# Patient Record
Sex: Female | Born: 1960 | Race: White | Hispanic: No | Marital: Married | Smoking: Never smoker
Health system: Southern US, Community
[De-identification: ages and names within clinical notes are randomized; demographics above are authoritative.]

## PROBLEM LIST (undated history)

## (undated) DIAGNOSIS — R51 Headache: Secondary | ICD-10-CM

## (undated) DIAGNOSIS — M545 Low back pain, unspecified: Secondary | ICD-10-CM

## (undated) DIAGNOSIS — R339 Retention of urine, unspecified: Secondary | ICD-10-CM

## (undated) DIAGNOSIS — T4145XA Adverse effect of unspecified anesthetic, initial encounter: Secondary | ICD-10-CM

## (undated) DIAGNOSIS — R112 Nausea with vomiting, unspecified: Secondary | ICD-10-CM

## (undated) DIAGNOSIS — T753XXA Motion sickness, initial encounter: Secondary | ICD-10-CM

## (undated) DIAGNOSIS — F32A Depression, unspecified: Secondary | ICD-10-CM

## (undated) DIAGNOSIS — N952 Postmenopausal atrophic vaginitis: Secondary | ICD-10-CM

## (undated) DIAGNOSIS — F329 Major depressive disorder, single episode, unspecified: Secondary | ICD-10-CM

## (undated) DIAGNOSIS — Z0282 Encounter for adoption services: Secondary | ICD-10-CM

## (undated) DIAGNOSIS — M1990 Unspecified osteoarthritis, unspecified site: Secondary | ICD-10-CM

## (undated) DIAGNOSIS — R319 Hematuria, unspecified: Secondary | ICD-10-CM

## (undated) DIAGNOSIS — R519 Headache, unspecified: Secondary | ICD-10-CM

## (undated) DIAGNOSIS — Z87442 Personal history of urinary calculi: Secondary | ICD-10-CM

## (undated) DIAGNOSIS — IMO0001 Reserved for inherently not codable concepts without codable children: Secondary | ICD-10-CM

## (undated) DIAGNOSIS — F419 Anxiety disorder, unspecified: Secondary | ICD-10-CM

## (undated) DIAGNOSIS — J45909 Unspecified asthma, uncomplicated: Secondary | ICD-10-CM

## (undated) DIAGNOSIS — T8859XA Other complications of anesthesia, initial encounter: Secondary | ICD-10-CM

## (undated) DIAGNOSIS — E039 Hypothyroidism, unspecified: Secondary | ICD-10-CM

## (undated) DIAGNOSIS — Z9889 Other specified postprocedural states: Secondary | ICD-10-CM

## (undated) HISTORY — DX: Encounter for adoption services: Z02.82

## (undated) HISTORY — DX: Anxiety disorder, unspecified: F41.9

## (undated) HISTORY — DX: Hypothyroidism, unspecified: E03.9

## (undated) HISTORY — DX: Reserved for inherently not codable concepts without codable children: IMO0001

## (undated) HISTORY — DX: Unspecified osteoarthritis, unspecified site: M19.90

## (undated) HISTORY — DX: Low back pain: M54.5

## (undated) HISTORY — PX: TMJ ARTHROPLASTY: SHX1066

## (undated) HISTORY — PX: INCONTINENCE SURGERY: SHX676

## (undated) HISTORY — DX: Major depressive disorder, single episode, unspecified: F32.9

## (undated) HISTORY — PX: CERVICAL POLYPECTOMY: SHX88

## (undated) HISTORY — DX: Unspecified asthma, uncomplicated: J45.909

## (undated) HISTORY — DX: Depression, unspecified: F32.A

## (undated) HISTORY — PX: KNEE SURGERY: SHX244

## (undated) HISTORY — PX: COLON SURGERY: SHX602

## (undated) HISTORY — DX: Low back pain, unspecified: M54.50

## (undated) HISTORY — DX: Retention of urine, unspecified: R33.9

## (undated) HISTORY — DX: Postmenopausal atrophic vaginitis: N95.2

## (undated) HISTORY — DX: Hematuria, unspecified: R31.9

---

## 2008-03-16 ENCOUNTER — Ambulatory Visit: Payer: Self-pay | Admitting: Family Medicine

## 2008-06-14 ENCOUNTER — Ambulatory Visit: Payer: Self-pay | Admitting: Internal Medicine

## 2008-06-28 ENCOUNTER — Ambulatory Visit: Payer: Self-pay | Admitting: Internal Medicine

## 2010-12-09 ENCOUNTER — Ambulatory Visit: Payer: Self-pay | Admitting: Internal Medicine

## 2010-12-31 ENCOUNTER — Ambulatory Visit: Payer: Self-pay | Admitting: Internal Medicine

## 2011-01-20 ENCOUNTER — Ambulatory Visit: Payer: Self-pay | Admitting: Obstetrics & Gynecology

## 2011-01-30 ENCOUNTER — Ambulatory Visit: Payer: Self-pay | Admitting: Obstetrics & Gynecology

## 2011-02-03 LAB — PATHOLOGY REPORT

## 2011-03-05 ENCOUNTER — Ambulatory Visit: Payer: Self-pay | Admitting: Obstetrics & Gynecology

## 2013-02-26 ENCOUNTER — Ambulatory Visit: Payer: Self-pay | Admitting: Family Medicine

## 2013-02-26 ENCOUNTER — Emergency Department: Payer: Self-pay | Admitting: Emergency Medicine

## 2013-02-26 LAB — BASIC METABOLIC PANEL
Anion Gap: 5 — ABNORMAL LOW (ref 7–16)
Calcium, Total: 8.9 mg/dL (ref 8.5–10.1)
Co2: 26 mmol/L (ref 21–32)
Creatinine: 0.92 mg/dL (ref 0.60–1.30)
EGFR (African American): >60
EGFR (Non-African Amer.): >60

## 2013-02-26 LAB — CK TOTAL AND CKMB (NOT AT ARMC): CK, Total: 109 U/L (ref 21–215)

## 2013-02-26 LAB — TROPONIN I: Troponin-I: <0.02 ng/mL

## 2013-02-26 LAB — CBC
MCH: 32.3 pg (ref 26.0–34.0)
MCHC: 33.1 g/dL (ref 32.0–36.0)
MCV: 97 fL (ref 80–100)

## 2013-03-13 ENCOUNTER — Ambulatory Visit: Payer: Self-pay | Admitting: Internal Medicine

## 2013-07-04 ENCOUNTER — Ambulatory Visit: Payer: Self-pay

## 2013-10-30 ENCOUNTER — Ambulatory Visit: Payer: Self-pay | Admitting: Urology

## 2014-02-26 LAB — COMPREHENSIVE METABOLIC PANEL
ALK PHOS: 57 U/L
ALT: 27 U/L (ref 12–78)
Albumin: 4.3 g/dL (ref 3.4–5.0)
Anion Gap: 3 — ABNORMAL LOW (ref 7–16)
BILIRUBIN TOTAL: 0.6 mg/dL (ref 0.2–1.0)
BUN: 21 mg/dL — ABNORMAL HIGH (ref 7–18)
CALCIUM: 9.2 mg/dL (ref 8.5–10.1)
Chloride: 107 mmol/L (ref 98–107)
Co2: 31 mmol/L (ref 21–32)
Creatinine: 0.76 mg/dL (ref 0.60–1.30)
Glucose: 91 mg/dL (ref 65–99)
OSMOLALITY: 284 (ref 275–301)
Potassium: 4.2 mmol/L (ref 3.5–5.1)
SGOT(AST): 38 U/L — ABNORMAL HIGH (ref 15–37)
SODIUM: 141 mmol/L (ref 136–145)
Total Protein: 7.5 g/dL (ref 6.4–8.2)

## 2014-02-26 LAB — URINALYSIS, COMPLETE
BILIRUBIN, UR: NEGATIVE
Bacteria: NONE SEEN
Blood: NEGATIVE
Glucose,UR: NEGATIVE mg/dL (ref 0–75)
Ketone: NEGATIVE
Nitrite: NEGATIVE
PH: 6 (ref 4.5–8.0)
PROTEIN: NEGATIVE
SPECIFIC GRAVITY: 1.02 (ref 1.003–1.030)
Squamous Epithelial: 1
WBC UR: 4 /HPF (ref 0–5)

## 2014-02-26 LAB — DRUG SCREEN, URINE

## 2014-02-26 LAB — CBC
HCT: 40.5 % (ref 35.0–47.0)
HGB: 13.3 g/dL (ref 12.0–16.0)
MCH: 31.6 pg (ref 26.0–34.0)
MCHC: 32.9 g/dL (ref 32.0–36.0)
MCV: 96 fL (ref 80–100)
Platelet: 181 10*3/uL (ref 150–440)
RBC: 4.22 10*6/uL (ref 3.80–5.20)
RDW: 13.1 % (ref 11.5–14.5)
WBC: 5.4 10*3/uL (ref 3.6–11.0)

## 2014-02-26 LAB — SALICYLATE LEVEL: Salicylates, Serum: <1.7 mg/dL

## 2014-02-26 LAB — T4, FREE: Free Thyroxine: 1.27 ng/dL (ref 0.76–1.46)

## 2014-02-26 LAB — ACETAMINOPHEN LEVEL: Acetaminophen: <2 ug/mL

## 2014-02-26 LAB — ETHANOL
Ethanol %: <0.003 % (ref 0.000–0.080)
Ethanol: <3 mg/dL

## 2014-02-27 ENCOUNTER — Inpatient Hospital Stay: Payer: Self-pay | Admitting: Psychiatry

## 2014-09-05 ENCOUNTER — Ambulatory Visit: Payer: Self-pay | Admitting: Urology

## 2014-09-19 ENCOUNTER — Ambulatory Visit: Payer: Self-pay | Admitting: Urology

## 2015-02-23 NOTE — Discharge Summary (Signed)
PATIENT NAME:  Sheila Cole, Sheila Cole MR#:  [id number] DATE OF BIRTH:  11-14-1960  DATE OF ADMISSION:  02/27/2014  DATE OF DISCHARGE:  03/05/2014  HOSPITAL COURSE: See dictated history and physical for details of admission. A 54 year old woman with a history of depression came into the hospital very depressed, with suicidal ideation, feeling hopeless and overwhelmed, multiple major life stresses. In the hospital, she was treated with medication management and group and individual psychotherapy. Her antidepressant medications were addressed and adjusted somewhat, with addition of a new antidepressant and increase in the dose of her previous medicine. She continued to have suicidal ideation for several days, but never acted in a dangerous way or did anything aggressive. Eventually her mood improved quite a bit, and at the time of discharge, she was consistently saying that her mood felt better. Denied any suicidal ideation. Felt more hopeful and optimistic about her outpatient treatment. Agreed to a plan for outpatient [facility]. The patient was counseled about the importance of staying on her medication.   DISCHARGE MEDICATIONS: Trazodone 100 mg at night, clonazepam 0.5 mg in the morning, Zantac 150 mg twice a day, levothyroxine 125 mcg once a day, ibuprofen 800 mg twice a day as needed for pain, sumatriptan 50 mg every 2 hours as needed orally for migraine, bupropion extended-release 300 mg once a day, mirtazapine 30 mg at night.   MENTAL STATUS EXAMINATION: A casually dressed, reasonably well-groomed woman, looks her stated age, cooperative with the interview. Good eye contact, normal psychomotor activity. Speech normal rate, tone and volume. Affect euthymic, reactive, appropriate. Mood stated as okay. Thoughts are lucid. No loosening of associations or delusions. Denies auditory or visual hallucinations. Denies suicidal or homicidal ideation. Shows good judgment and  insight. Normal intelligence. Alert and oriented x 4.   LABORATORY RESULTS: Admission labs included a negative drug screen. Urinalysis unremarkable. CBC unremarkable. Chemistry panel: Only a slightly elevated BUN of no significance at 21. Alcohol negative. Acetaminophen and salicylates negative. Free thyroxine normal. CT scan of the neck done while she was here did not show any blockage. Her complaints of difficulty swallowing were addressed during her hospitalization by Speech Therapy, and showed significant improvement by the time of discharge. I suspect it was largely related to anxiety.   DISPOSITION: Discharge home. [facility].   DIAGNOSIS, PRINCIPAL AND PRIMARY:  AXIS I: Major depression, severe, recurrent.   SECONDARY DIAGNOSES: AXIS I: Anxiety disorder, not otherwise specified. AXIS II: Deferred. AXIS III: History of thyroidectomy, on thyroid replacement. Chronic pain and headaches. Dysphagia of unclear etiology, resolving.  AXIS IV: Severe from recent breakup of her marriage.  AXIS V: Functioning at time of discharge: 60.      ____________________________ Audery AmelJohn T. Clapacs, MD jtc:mr D: 03/24/2014 12:16:28 ET T: 03/24/2014 22:39:58 ET JOB#: [id number]  cc: Audery AmelJohn T. Clapacs, MD, <Dictator> Audery AmelJOHN T CLAPACS MD ELECTRONICALLY SIGNED 04/05/2014 11:31

## 2015-02-23 NOTE — H&P (Signed)
PATIENT NAME:  Sheila Cole, Sheila Cole MR#:  [id number] DATE OF BIRTH:  [birth year]  DATE OF ADMISSION:  02/27/2014   IDENTIFYING INFORMATION AND CHIEF COMPLAINT:  This is a 54 year old woman who came to the Emergency Room at the referral of her primary care doctor.   CHIEF COMPLAINT:  "I am feeling suicidal."   HISTORY OF PRESENT ILLNESS:  Information obtained from the patient and the chart.  The patient came to the Emergency Room stating that she had been having worsening depressive symptoms for months that had culminated with intrusive thoughts of suicide.  She says that she has probably been feeling depressed for a year or more, but that things just started to get much worse in the last couple months and especially bad in the last few weeks.  The last couple of weeks she has been feeling down and sad all the time.  Energy is very poor.  No interest in normal activities.  Decreased appetite.  She started having intrusive suicidal ideation with thoughts of overdosing on sleeping pills.  She informed her primary care doctor that she was feeling suicidal.  He had her come to the hospital for evaluation.  The patient does not report any psychotic symptoms.  She denies that she has been abusing alcohol or drugs.  She is not currently taking an antidepressant medication.  A major stress on her is that her husband had gone into rehab for substance abuse and when he came out he immediately relapsed into drinking.  She had been hoping for some improvement in their relationship and this pushed her over the edge.   PAST PSYCHIATRIC HISTORY:  The patient has had psychiatric treatment in the past.  As a teenager she had suicidal ideation and some wrist cutting, but no hospitalization.  She had been treated in the past by her primary care doctor with antidepressant medication and had been on bupropion XL 150 mg a day.  She reports that when the dose was increased her insurance would not pay for it so she stopped taking it.  No  history of psychosis.   SUBSTANCE ABUSE HISTORY:  Denies regular alcohol use or drug use herself.  Says she drinks alcohol only occasionally and has not increased her use of it.   FAMILY HISTORY:  Not aware of any.   SOCIAL HISTORY:  The patient lives with her two adolescent sons.  She and her husband are separated.  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  They have someone else managing it so she does not necessarily need to be there constantly.   PAST MEDICAL HISTORY:  The patient has a history of having a goiter removed resulting in clinical hypothyroidism for which she takes thyroid supplement.  She has migraine headaches.  Gastric reflux symptoms.   CURRENT MEDICATIONS:  Synthroid 125 mcg per day, clonazepam 0.5 mg per day as needed for anxiety, Zantac 150 mg twice a day, Aleve 3 to 4 times a day for arthritis pain.   ALLERGIES:  No known drug allergies.   REVIEW OF SYSTEMS:  Depressed mood.  Low energy.  Poor appetite.  Suicidal ideation.  Denies hallucinations.  Denies paranoia.  She has aches and pains in multiple joints.  The rest of the regular review of systems is negative.    MENTAL STATUS EXAMINATION:  Casually dressed, adequately groomed woman, looks her stated age, cooperative with the interview.  Eye contact good.  Psychomotor activity a little slow and restrained.  Speech is easily understood and normal in tone, but decreased in total amount.  Affect blunted.  Mood stated as depressed.  Thoughts are generally lucid.  No obvious delusions.  Denies auditory or visual hallucinations.  Endorses ongoing suicidal ideation.  Denies homicidal ideation.  Judgment and insight a little bit impaired by the degree of her depression.  Intelligence normal.  Short and long-term memory intact to testing.   PHYSICAL EXAMINATION: GENERAL:  Tall rather thin woman who gives the impression of being  exceptionally thin for her height.  SKIN:  No skin lesions identified.  HEENT:  Pupils equal and reactive.  Face symmetric.  Oral mucosa normal.  NECK AND BACK:  Nontender to palpation.  EXTREMITIES:  Full range of motion at all extremities.  Normal gait.  Strength and reflexes normal and symmetric throughout.  Cranial nerves symmetric and normal.  LUNGS:  Clear without any wheezing.  HEART:  Sounds regular rate and rhythm.  ABDOMEN:  Soft, nontender, normal bowel sounds.  VITAL SIGNS:  At the time of evaluation included temperature of 97.5, pulse 66, respirations 20, blood pressure 123/83.   LABORATORY RESULTS:  Admission labs include free thyroxine 1.27 which is normal.  Alcohol level non-detected.  AST slightly elevated at 38.  BUN slightly elevated at 21.  Drug screen negative.  CBC normal.   ASSESSMENT:  A 54 year old woman who gives symptoms of multiple symptoms of major depression.  Ongoing suicidal ideation.  Not psychotic.  Had not been on any antidepressant, not seeing a psychiatrist recently.  Needs hospitalization for suicidality and treatment.   TREATMENT PLAN:  Admit to psychiatry.  I discussed medication options with her and suggested that based on her past history we start her on bupropion again.  Start her right back on 300 mg of bupropion XL.  Continue trazodone 100 mg at night as needed for sleep.  Continue clonazepam as needed.  Treat medical problems appropriately.  Engage the patient in groups and activities on the unit, work on referral for outpatient treatment when she is ready for discharge.   DIAGNOSIS, PRINCIPAL AND PRIMARY:  AXIS I:  Major depression, single, severe.   SECONDARY DIAGNOSES: AXIS I:  No further. AXIS II:  Deferred.  AXIS III:  Migraine headaches, hypothyroidism possibly, status post surgery, arthritis.  AXIS IV:  Severe stress from financial problems and the difficulty in her marriage.  AXIS V:  Functioning at time of evaluation 30.      ____________________________ Audery Amel, MD jtc:ea D: 02/28/2014 22:52:46 ET T: 02/28/2014 23:51:24 ET JOB#: [id number]  cc: Audery Amel, MD, <Dictator> Audery Amel MD ELECTRONICALLY SIGNED 03/01/2014 13:22

## 2015-03-22 ENCOUNTER — Other Ambulatory Visit: Payer: Self-pay | Admitting: Urology

## 2015-03-22 DIAGNOSIS — R310 Gross hematuria: Secondary | ICD-10-CM

## 2015-03-26 ENCOUNTER — Ambulatory Visit: Admission: RE | Admit: 2015-03-26 | Payer: No Typology Code available for payment source | Source: Ambulatory Visit

## 2015-04-03 ENCOUNTER — Ambulatory Visit: Payer: Self-pay | Admitting: Urology

## 2015-04-04 ENCOUNTER — Telehealth: Payer: Self-pay | Admitting: Urology

## 2015-04-04 NOTE — Telephone Encounter (Signed)
Please call the patient to r/s her CT Urogram

## 2015-04-17 ENCOUNTER — Encounter: Payer: Self-pay | Admitting: Urology

## 2015-04-17 NOTE — Telephone Encounter (Signed)
Letter sent to patient to contact our office to reschedule CT scan and follow up.

## 2015-12-04 ENCOUNTER — Other Ambulatory Visit: Payer: Self-pay | Admitting: Orthopedic Surgery

## 2015-12-04 DIAGNOSIS — M25561 Pain in right knee: Secondary | ICD-10-CM

## 2015-12-04 DIAGNOSIS — M2391 Unspecified internal derangement of right knee: Secondary | ICD-10-CM

## 2015-12-04 DIAGNOSIS — G8929 Other chronic pain: Secondary | ICD-10-CM

## 2015-12-26 ENCOUNTER — Ambulatory Visit: Payer: No Typology Code available for payment source

## 2015-12-30 ENCOUNTER — Ambulatory Visit
Admission: RE | Admit: 2015-12-30 | Discharge: 2015-12-30 | Disposition: A | Discharge status: Home / Self Care | Payer: Managed Care, Other (non HMO) | Source: Ambulatory Visit | Attending: Orthopedic Surgery | Admitting: Orthopedic Surgery

## 2015-12-30 DIAGNOSIS — X58XXXA Exposure to other specified factors, initial encounter: Secondary | ICD-10-CM | POA: Diagnosis not present

## 2015-12-30 DIAGNOSIS — G8929 Other chronic pain: Secondary | ICD-10-CM

## 2015-12-30 DIAGNOSIS — S83281A Other tear of lateral meniscus, current injury, right knee, initial encounter: Secondary | ICD-10-CM | POA: Diagnosis not present

## 2015-12-30 DIAGNOSIS — M2391 Unspecified internal derangement of right knee: Secondary | ICD-10-CM

## 2015-12-30 DIAGNOSIS — M25561 Pain in right knee: Secondary | ICD-10-CM | POA: Diagnosis present

## 2016-01-20 ENCOUNTER — Encounter: Payer: Self-pay | Admitting: *Deleted

## 2016-01-24 ENCOUNTER — Ambulatory Visit: Payer: Managed Care, Other (non HMO) | Admitting: Anesthesiology

## 2016-01-24 ENCOUNTER — Encounter
Admission: RE | Disposition: A | Discharge status: Home / Self Care | Payer: Self-pay | Source: Ambulatory Visit | Attending: Unknown Physician Specialty

## 2016-01-24 ENCOUNTER — Ambulatory Visit
Admission: RE | Admit: 2016-01-24 | Discharge: 2016-01-24 | Disposition: A | Discharge status: Home / Self Care | Payer: Managed Care, Other (non HMO) | Source: Ambulatory Visit | Attending: Unknown Physician Specialty | Admitting: Unknown Physician Specialty

## 2016-01-24 DIAGNOSIS — F419 Anxiety disorder, unspecified: Secondary | ICD-10-CM | POA: Insufficient documentation

## 2016-01-24 DIAGNOSIS — E039 Hypothyroidism, unspecified: Secondary | ICD-10-CM | POA: Insufficient documentation

## 2016-01-24 DIAGNOSIS — Z79899 Other long term (current) drug therapy: Secondary | ICD-10-CM | POA: Diagnosis not present

## 2016-01-24 DIAGNOSIS — J45909 Unspecified asthma, uncomplicated: Secondary | ICD-10-CM | POA: Diagnosis not present

## 2016-01-24 DIAGNOSIS — R51 Headache: Secondary | ICD-10-CM | POA: Diagnosis not present

## 2016-01-24 DIAGNOSIS — M23241 Derangement of anterior horn of lateral meniscus due to old tear or injury, right knee: Secondary | ICD-10-CM | POA: Insufficient documentation

## 2016-01-24 DIAGNOSIS — M23261 Derangement of other lateral meniscus due to old tear or injury, right knee: Secondary | ICD-10-CM | POA: Diagnosis not present

## 2016-01-24 DIAGNOSIS — M1990 Unspecified osteoarthritis, unspecified site: Secondary | ICD-10-CM | POA: Insufficient documentation

## 2016-01-24 DIAGNOSIS — M419 Scoliosis, unspecified: Secondary | ICD-10-CM | POA: Insufficient documentation

## 2016-01-24 DIAGNOSIS — M25561 Pain in right knee: Secondary | ICD-10-CM | POA: Diagnosis present

## 2016-01-24 HISTORY — DX: Headache, unspecified: R51.9

## 2016-01-24 HISTORY — DX: Other specified postprocedural states: Z98.890

## 2016-01-24 HISTORY — DX: Adverse effect of unspecified anesthetic, initial encounter: T41.45XA

## 2016-01-24 HISTORY — DX: Nausea with vomiting, unspecified: R11.2

## 2016-01-24 HISTORY — DX: Headache: R51

## 2016-01-24 HISTORY — DX: Other complications of anesthesia, initial encounter: T88.59XA

## 2016-01-24 HISTORY — PX: KNEE ARTHROSCOPY: SHX127

## 2016-01-24 SURGERY — ARTHROSCOPY, KNEE
Anesthesia: General | Site: Knee | Laterality: Right | Wound class: Clean

## 2016-01-24 MED ORDER — SCOPOLAMINE 1 MG/3DAYS TD PT72
1.0000 | MEDICATED_PATCH | TRANSDERMAL | Status: DC
  Administered 2016-01-24: 1.5 mg via TRANSDERMAL

## 2016-01-24 MED ORDER — MIDAZOLAM HCL 2 MG/2ML IJ SOLN
1.0000 mg | Freq: Once | INTRAMUSCULAR | Status: AC
  Administered 2016-01-24: 1 mg via INTRAVENOUS

## 2016-01-24 MED ORDER — GLYCOPYRROLATE 0.2 MG/ML IJ SOLN
INTRAMUSCULAR | Status: DC | PRN
  Administered 2016-01-24: 0.1 mg via INTRAVENOUS

## 2016-01-24 MED ORDER — ONDANSETRON HCL 4 MG/2ML IJ SOLN
4.0000 mg | Freq: Once | INTRAMUSCULAR | Status: AC | PRN
  Administered 2016-01-24: 4 mg via INTRAVENOUS

## 2016-01-24 MED ORDER — OXYCODONE HCL 5 MG/5ML PO SOLN: 5.0000 mg | Freq: Once | ORAL | Status: DC | PRN

## 2016-01-24 MED ORDER — LACTATED RINGERS IV SOLN
INTRAVENOUS | Status: DC
  Administered 2016-01-24: 08:00:00 via INTRAVENOUS

## 2016-01-24 MED ORDER — METOCLOPRAMIDE HCL 5 MG/ML IJ SOLN
INTRAMUSCULAR | Status: DC | PRN
  Administered 2016-01-24: 10 mg via INTRAVENOUS

## 2016-01-24 MED ORDER — DEXAMETHASONE SODIUM PHOSPHATE 4 MG/ML IJ SOLN
INTRAMUSCULAR | Status: DC | PRN
  Administered 2016-01-24: 4 mg via INTRAVENOUS

## 2016-01-24 MED ORDER — LACTATED RINGERS IV SOLN: 500.0000 mL | INTRAVENOUS | Status: DC

## 2016-01-24 MED ORDER — OXYCODONE HCL 5 MG PO TABS: 5.0000 mg | ORAL_TABLET | Freq: Once | ORAL | Status: DC | PRN

## 2016-01-24 MED ORDER — FENTANYL CITRATE (PF) 100 MCG/2ML IJ SOLN
INTRAMUSCULAR | Status: DC | PRN
  Administered 2016-01-24 (×2): 25 ug via INTRAVENOUS
  Administered 2016-01-24: 50 ug via INTRAVENOUS
  Administered 2016-01-24: 25 ug via INTRAVENOUS

## 2016-01-24 MED ORDER — LIDOCAINE HCL (CARDIAC) 20 MG/ML IV SOLN
INTRAVENOUS | Status: DC | PRN
  Administered 2016-01-24: 40 mg via INTRATRACHEAL

## 2016-01-24 MED ORDER — ONDANSETRON HCL 4 MG/2ML IJ SOLN
INTRAMUSCULAR | Status: DC | PRN
  Administered 2016-01-24: 4 mg via INTRAVENOUS

## 2016-01-24 MED ORDER — ACETAMINOPHEN 160 MG/5ML PO SOLN: 325.0000 mg | ORAL | Status: DC | PRN

## 2016-01-24 MED ORDER — ACETAMINOPHEN 325 MG PO TABS: 325.0000 mg | ORAL_TABLET | ORAL | Status: DC | PRN

## 2016-01-24 MED ORDER — TRAMADOL HCL 50 MG PO TABS
50.0000 mg | ORAL_TABLET | Freq: Once | ORAL | Status: AC
  Administered 2016-01-24: 50 mg via ORAL

## 2016-01-24 MED ORDER — HYDROMORPHONE HCL 1 MG/ML IJ SOLN: 0.2500 mg | INTRAMUSCULAR | Status: DC | PRN

## 2016-01-24 MED ORDER — HYDROCODONE-ACETAMINOPHEN 5-325 MG PO TABS: 1.0000 | ORAL_TABLET | Freq: Four times a day (QID) | ORAL | Status: DC | PRN

## 2016-01-24 MED ORDER — MIDAZOLAM HCL 5 MG/5ML IJ SOLN
INTRAMUSCULAR | Status: DC | PRN
  Administered 2016-01-24: 2 mg via INTRAVENOUS

## 2016-01-24 MED ORDER — TRAMADOL HCL 50 MG PO TABS
50.0000 mg | ORAL_TABLET | Freq: Once | ORAL | Status: AC
Start: 2016-01-24 — End: 2016-01-24
  Administered 2016-01-24: 50 mg via ORAL

## 2016-01-24 MED ORDER — ROPIVACAINE HCL 5 MG/ML IJ SOLN
INTRAMUSCULAR | Status: DC | PRN
  Administered 2016-01-24: 20 mL

## 2016-01-24 MED ORDER — PROPOFOL 10 MG/ML IV BOLUS
INTRAVENOUS | Status: DC | PRN
  Administered 2016-01-24: 140 mg via INTRAVENOUS

## 2016-01-24 SURGICAL SUPPLY — 47 items
ARTHROWAND PARAGON T2 (SURGICAL WAND) ×2
BLADE ABRADER 4.5 (BLADE) ×2 IMPLANT
BLADE FULL RADIUS 3.5 (BLADE) IMPLANT
BLADE SHAVER 4.5X7 STR FR (MISCELLANEOUS) ×2 IMPLANT
BLADE SHAVER AGGRES 5.5  STR (CUTTER)
BLADE SHAVER AGGRES 5.5 STR (CUTTER) IMPLANT
BNDG ESMARK 6X12 TAN STRL LF (GAUZE/BANDAGES/DRESSINGS) IMPLANT
BUR 5.5 NOTCHBLASTER STR (BURR) IMPLANT
BUR ABRADER 4.0 W/FLUTE AQUA (MISCELLANEOUS) IMPLANT
BUR ABRADER 5.5 BLK (MISCELLANEOUS) IMPLANT
BUR ACROMIONIZER 4.0 (BURR) ×2 IMPLANT
BUR BR 5.5 WIDE MOUTH (BURR) IMPLANT
BURR 5.5 NOTCHBLASTER STR (BURR)
BURR ABRADER 4.0 W/FLUTE AQUA (MISCELLANEOUS)
BURR ABRADER 5.5 BLK (MISCELLANEOUS)
CANNULA THRD 8.5X72 DISP (CANNULA) IMPLANT
COVER LIGHT HANDLE UNIVERSAL (MISCELLANEOUS) ×4 IMPLANT
CUFF TOURN SGL QUICK 24 (TOURNIQUET CUFF) ×1
CUFF TOURN SGL QUICK 30 (MISCELLANEOUS)
CUFF TOURN SGL QUICK 34 (TOURNIQUET CUFF)
CUFF TRNQT CYL 24X4X40X1 (TOURNIQUET CUFF) ×1 IMPLANT
CUFF TRNQT CYL 34X4X40X1 (TOURNIQUET CUFF) IMPLANT
CUFF TRNQT CYL LO 30X4X (MISCELLANEOUS) IMPLANT
DRAPE LEGGINS SURG 28X43 STRL (DRAPES) ×2 IMPLANT
DURAPREP 26ML APPLICATOR (WOUND CARE) ×2 IMPLANT
GAUZE SPONGE 4X4 12PLY STRL (GAUZE/BANDAGES/DRESSINGS) ×2 IMPLANT
GLOVE BIO SURGEON STRL SZ7.5 (GLOVE) ×4 IMPLANT
GLOVE BIO SURGEON STRL SZ8 (GLOVE) ×2 IMPLANT
GLOVE INDICATOR 8.0 STRL GRN (GLOVE) ×4 IMPLANT
GOWN STRL REIN 2XL XLG LVL4 (GOWN DISPOSABLE) ×4 IMPLANT
GOWN STRL REUS W/TWL 2XL LVL3 (GOWN DISPOSABLE) IMPLANT
IV LACTATED RINGER IRRG 3000ML (IV SOLUTION) ×2
IV LR IRRIG 3000ML ARTHROMATIC (IV SOLUTION) ×2 IMPLANT
KIT ROOM TURNOVER OR (KITS) ×2 IMPLANT
MANIFOLD 4PT FOR NEPTUNE1 (MISCELLANEOUS) ×2 IMPLANT
PACK ARTHROSCOPY KNEE (MISCELLANEOUS) ×2 IMPLANT
SET TUBE SUCT SHAVER OUTFL 24K (TUBING) ×2 IMPLANT
SOL PREP PVP 2OZ (MISCELLANEOUS) ×2
SOLUTION PREP PVP 2OZ (MISCELLANEOUS) ×1 IMPLANT
SUT ETHILON 3-0 FS-10 30 BLK (SUTURE) ×2
SUTURE EHLN 3-0 FS-10 30 BLK (SUTURE) ×1 IMPLANT
TAPE MICROFOAM 4IN (TAPE) ×2 IMPLANT
TUBING ARTHRO INFLOW-ONLY STRL (TUBING) ×2 IMPLANT
WAND ARTHRO PARAGON T2 (SURGICAL WAND) ×1 IMPLANT
WAND HAND CNTRL MULTIVAC 50 (MISCELLANEOUS) ×2 IMPLANT
WAND HAND CNTRL MULTIVAC 90 (MISCELLANEOUS) ×2 IMPLANT
WRAP KNEE W/COLD PACKS 25.5X14 (SOFTGOODS) ×2 IMPLANT

## 2016-01-24 NOTE — Anesthesia Postprocedure Evaluation (Signed)
Anesthesia Post Note  Patient: Sheila Cole  Procedure(s) Performed: Procedure(s) (LRB): ARTHROSCOPY KNEE RIGHT KNEE WITH PARTIAL LATERAL MENISECTOMY AND ABRAISION CHONDROPLASTY (Right)  Patient location during evaluation: PACU Anesthesia Type: General Level of consciousness: awake and alert Pain management: pain level controlled Vital Signs Assessment: post-procedure vital signs reviewed and stable Respiratory status: spontaneous breathing, nonlabored ventilation, respiratory function stable and patient connected to nasal cannula oxygen Cardiovascular status: blood pressure returned to baseline and stable Postop Assessment: no signs of nausea or vomiting Anesthetic complications: no    DANIEL D KOVACS

## 2016-01-24 NOTE — Progress Notes (Signed)
Pt stating she was having difficult time breathing and felt anxious, VSS, anesthesia to bedside. Note new orders

## 2016-01-24 NOTE — H&P (Signed)
  H and P reviewed. No changes. Uploaded at later date. 

## 2016-01-24 NOTE — Anesthesia Procedure Notes (Addendum)
Procedure Name: LMA Insertion Date/Time: 01/24/2016 8:59 AM Performed by: Jimmy PicketAMYOT, Xochitl Egle Pre-anesthesia Checklist: Patient identified, Emergency Drugs available, Suction available, Timeout performed and Patient being monitored Patient Re-evaluated:Patient Re-evaluated prior to inductionOxygen Delivery Method: Circle system utilized Preoxygenation: Pre-oxygenation with 100% oxygen Intubation Type: IV induction LMA: LMA inserted LMA Size: 4.0 Number of attempts: 1 Placement Confirmation: positive ETCO2 and breath sounds checked- equal and bilateral Tube secured with: Tape

## 2016-01-24 NOTE — Transfer of Care (Signed)
Immediate Anesthesia Transfer of Care Note  Patient: Sheila Cole  Procedure(s) Performed: Procedure(s): ARTHROSCOPY KNEE RIGHT KNEE WITH PARTIAL LATERAL MENISECTOMY AND ABRAISION CHONDROPLASTY (Right)  Patient Location: PACU  Anesthesia Type: General LMA  Level of Consciousness: awake, alert  and patient cooperative  Airway and Oxygen Therapy: Patient Spontanous Breathing and Patient connected to supplemental oxygen  Post-op Assessment: Post-op Vital signs reviewed, Patient's Cardiovascular Status Stable, Respiratory Function Stable, Patent Airway and No signs of Nausea or vomiting  Post-op Vital Signs: Reviewed and stable  Complications: No apparent anesthesia complications

## 2016-01-24 NOTE — Op Note (Signed)
Patient: Sheila Cole, Sheila Y.  Preoperative diagnosis: Torn lateral meniscus right knee along with multiple chondral lesions, most severe in the lateral compartment  Postop diagnosis: Same  Operation: Arthroscopic partial lateral meniscectomy along with debridement and Coblation of the chondral lesions plus abrasion chondroplasty of the lateral tibial plateau lesion  Surgeon: Randon GoldsmithKERNODLE,Cinderella Christoffersen B, MD  Anesthesia: Gen.   History: Patient's had a long history of right knee pain.  The plain films revealed mild narrowing of the lateral compartment .  The patient had an MRI which revealed a torn lateral meniscus plus tricompartmental chondral pathology with the lateral compartment being the most involved.The patient was scheduled for surgery due to persistent discomfort despite conservative treatment.  The patient was taken the operating room where satisfactory general anesthesia was achieved. A tourniquet and leg holder were was applied to the right thigh. A well leg support was applied to the nonoperative extremity. The right knee was prepped and draped in usual fashion for an arthroscopic procedure. An inflow cannula was introduced superomedially. The joint was distended with lactated Ringer's. Scope was introduced through an inferolateral puncture wound and a probe through an inferomedial puncture wound. Inspection of the medial compartment revealed  grade 2 chondral changes in the anterior aspect of the weightbearing portion of the medial femoral condyle. No meniscal pathology was noted in the medial compartment. Inspection of the intercondylar notch revealed intact cruciates. Inspection of the the lateral compartment revealed degenerative tears of the lateral meniscus involving primarily the anterior one third and middle one third. There were grade 3 chondral changes in the mid weightbearing portion of the lateral femoral condyle and a grade 4 chondral change that was just medial to the middle third of the  torn lateral meniscus. I went ahead and resected the torn portions of the lateral meniscus with a motorized resector. The remaining lateral meniscus rim was contoured with angled ArthroCare radiofrequency wands. The grade 3 lateral femoral chondral lesion was debrided with a turbo whisker and then coblated with an ArthroCare Paragon wand. The small grade 4 tibial plateau lesion was abraded with a small round power bur. Trochlear groove was inspected and appeared to be fairly smooth.  Retropatellar surface was fibrillated, primarily in the central and lateral facet area. I debrided the fibrillar material with a turbo whisker. I observed patella tracking from the superomedial portal. The patella seemed to track fairly well.  The instruments were removed from the joint at this time. The puncture wounds were closed with 3-0 nylon in vertical mattress fashion. I injected each puncture wound with several cc of half percent Marcaine without epinephrine. Betadine was applied the wounds followed by sterile dressing. An ice pack was applied to the right knee. The patient was awakened and transferred to the stretcher bed. The patient was taken to the recovery room in satisfactory condition.  The tourniquet was not inflated during the course of the procedure. Blood loss was negligible.

## 2016-01-24 NOTE — Addendum Note (Signed)
Addendum  created 01/24/16 1054 by Karren Burlyaniel D Madalee Altmann, MD   Modules edited: Orders

## 2016-01-24 NOTE — Addendum Note (Signed)
Addendum  created 01/24/16 1041 by Karren Burlyaniel D Kovacs, MD   Modules edited: Orders

## 2016-01-24 NOTE — Anesthesia Preprocedure Evaluation (Signed)
Anesthesia Evaluation  Patient identified by MRN, date of birth, ID band Patient awake    Reviewed: Allergy & Precautions, H&P , NPO status , Patient's Chart, lab work & pertinent test results, reviewed documented beta blocker date and time   History of Anesthesia Complications (+) PONV and history of anesthetic complications  Airway Mallampati: II  TM Distance: >3 FB Neck ROM: full    Dental no notable dental hx.    Pulmonary asthma ,    Pulmonary exam normal breath sounds clear to auscultation       Cardiovascular Exercise Tolerance: Good negative cardio ROS   Rhythm:regular Rate:Normal     Neuro/Psych  Headaches, PSYCHIATRIC DISORDERS    GI/Hepatic negative GI ROS, Neg liver ROS,   Endo/Other  Hypothyroidism   Renal/GU negative Renal ROS  negative genitourinary   Musculoskeletal   Abdominal   Peds  Hematology negative hematology ROS (+)   Anesthesia Other Findings   Reproductive/Obstetrics negative OB ROS                             Anesthesia Physical Anesthesia Plan  ASA: II  Anesthesia Plan: General LMA   Post-op Pain Management:    Induction:   Airway Management Planned:   Additional Equipment:   Intra-op Plan:   Post-operative Plan:   Informed Consent: I have reviewed the patients History and Physical, chart, labs and discussed the procedure including the risks, benefits and alternatives for the proposed anesthesia with the patient or authorized representative who has indicated his/her understanding and acceptance.     Plan Discussed with: CRNA  Anesthesia Plan Comments:         Anesthesia Quick Evaluation

## 2016-01-24 NOTE — Addendum Note (Signed)
Addendum  created 01/24/16 1049 by Karren Burlyaniel D Kovacs, MD   Modules edited: Orders

## 2016-01-24 NOTE — Discharge Instructions (Signed)
General Anesthesia, Adult, Care After °Refer to this sheet in the next few weeks. These instructions provide you with information on caring for yourself after your procedure. Your health care provider may also give you more specific instructions. Your treatment has been planned according to current medical practices, but problems sometimes occur. Call your health care provider if you have any problems or questions after your procedure. °WHAT TO EXPECT AFTER THE PROCEDURE °After the procedure, it is typical to experience: °· Sleepiness. °· Nausea and vomiting. °HOME CARE INSTRUCTIONS °· For the first 24 hours after general anesthesia: °¨ Have a responsible person with you. °¨ Do not drive a car. If you are alone, do not take public transportation. °¨ Do not drink alcohol. °¨ Do not take medicine that has not been prescribed by your health care provider. °¨ Do not sign important papers or make important decisions. °¨ You may resume a normal diet and activities as directed by your health care provider. °· Change bandages (dressings) as directed. °· If you have questions or problems that seem related to general anesthesia, call the hospital and ask for the anesthetist or anesthesiologist on call. °SEEK MEDICAL CARE IF: °· You have nausea and vomiting that continue the day after anesthesia. °· You develop a rash. °SEEK IMMEDIATE MEDICAL CARE IF:  °· You have difficulty breathing. °· You have chest pain. °· You have any allergic problems. °  °This information is not intended to replace advice given to you by your health care provider. Make sure you discuss any questions you have with your health care provider. °  °Document Released: 01/25/2001 Document Revised: 11/09/2014 Document Reviewed: 02/17/2012 °Elsevier Interactive Patient Education ©2016 [employer] ° ° °[facility]  °[place] B. Ardella Chhim, Jr., M.D. [phone]  ° °KNEE ARTHROSCOPY POST OPERATION INSTRUCTIONS: ° °PLEASE  READ THESE INSTRUCTIONS ABOUT POST OPERATION CARE. THEY WILL ANSWER MOST OF YOUR QUESTIONS.  °You have been given a prescription for pain. Please take as directed for pain.  °You can walk, keeping the knee slightly stiff-avoid doing too much bending the first day. (if ACL reconstruction is performed, keep brace locked in extension when walking.)  °You will use crutches or cane if needed. Can weight bear as tolerated  °Plan to take three to four days off from work. You can resume work when you are comfortable. (This can be a week or more, depending on the type of work you do.)  °To reduce pain and swelling, place one to two pillows under the knee the first two or three days when sitting or lying. An ice pack may be placed on top of the area over the dressing. Instructions for making homemade icepack are as follow:  °Flexible homemade alcohol water ice pack  °2 cups water  °1 cup rubbing alcohol  °food coloring for the blue tint (optional)  °2 zip-top bags - gallon-size  °Mix the water and alcohol together in one of your zip-top bags and add food coloring. Release as much air as possible and seal the bag. Place in freezer for at least 12 hours.  °The small incisions in your knee are closed with nylon stitches. They will be removed in the office.  °The bulky dressing may be removed in the third day after surgery. (If ACL surgery-DO NOT REMOVE BANDAGES). Put a waterproof band-aid over each stitch. Do not put any creams or ointments on wounds. You may shower at this time, but change waterproof band-aids after showering. KEEP INCISIONS CLEAN   AND DRY UNTIL YOU RETURN TO THE OFFICE.  °Sometimes the operative area remains somewhat painful and swollen for several weeks. This is usually nothing to worry about, but call if you have any excessive symptoms, especially fever. It is not unusual to have a low grade fever of 99 degrees for the first few days. If persist after 3-4 days call the office. It is not uncommon for the pain  to be a little worse on the third day after surgery.  °Begin doing gentle exercises right away. They will be limited by the amount of pain and swelling you have.  Exercising will reduce the swelling, increase motion, and prevent muscle weakness. Exercises: Straight leg raising and gentle knee bending.  °Take 81 milligram aspirin twice a day for 2 weeks after meals or milk. This along with elevation will help reduce the possibility of phlebitis in your operated leg.  °Avoid strenuous athletics for a minimum of 4 to 6 weeks after arthroscopic surgery (approximately five months if ACL surgery).  °If the surgery included ACL reconstruction the brace that is supplied to the extremity post surgery is to be locked in extension when you are asleep and is to be locked in extension when you are ambulating. It can be unlocked for exercises or sitting.  °Keep your post surgery appointment that has been made for you. If you do not remember the date call [phone]. Your follow up appointment should be between 7-10 days.  ° °

## 2016-01-27 ENCOUNTER — Encounter: Payer: Self-pay | Admitting: Unknown Physician Specialty

## 2019-02-22 ENCOUNTER — Ambulatory Visit
Admission: RE | Admit: 2019-02-22 | Discharge: 2019-02-22 | Disposition: A | Discharge status: Home / Self Care | Payer: 59 | Attending: Family Medicine | Admitting: Family Medicine

## 2019-02-22 ENCOUNTER — Ambulatory Visit
Admission: RE | Admit: 2019-02-22 | Discharge: 2019-02-22 | Disposition: A | Discharge status: Home / Self Care | Payer: 59 | Source: Ambulatory Visit | Attending: Family Medicine | Admitting: Family Medicine

## 2019-02-22 ENCOUNTER — Other Ambulatory Visit: Payer: Self-pay

## 2019-02-22 ENCOUNTER — Other Ambulatory Visit: Payer: Self-pay | Admitting: Family Medicine

## 2019-02-22 DIAGNOSIS — S99921A Unspecified injury of right foot, initial encounter: Secondary | ICD-10-CM | POA: Insufficient documentation

## 2020-01-25 ENCOUNTER — Ambulatory Visit: Payer: 59 | Attending: Internal Medicine

## 2020-11-06 ENCOUNTER — Other Ambulatory Visit: Payer: Self-pay

## 2020-11-06 ENCOUNTER — Ambulatory Visit
Admission: EM | Admit: 2020-11-06 | Discharge: 2020-11-06 | Disposition: A | Discharge status: Home / Self Care | Payer: BC Managed Care – PPO | Attending: Sports Medicine | Admitting: Sports Medicine

## 2020-11-06 DIAGNOSIS — N3090 Cystitis, unspecified without hematuria: Secondary | ICD-10-CM | POA: Diagnosis present

## 2020-11-06 LAB — URINALYSIS, COMPLETE (UACMP) WITH MICROSCOPIC
Bilirubin Urine: NEGATIVE
Glucose, UA: NEGATIVE mg/dL
Nitrite: POSITIVE — AB
Protein, ur: 100 mg/dL — AB
Specific Gravity, Urine: 1.025 (ref 1.005–1.030)
WBC, UA: >50 WBC/hpf (ref 0–5)
pH: 5.5 (ref 5.0–8.0)

## 2020-11-06 MED ORDER — CEPHALEXIN 500 MG PO CAPS: 500.0000 mg | ORAL_CAPSULE | Freq: Two times a day (BID) | ORAL | 0 refills | Status: AC

## 2020-11-06 MED ORDER — PHENAZOPYRIDINE HCL 200 MG PO TABS: 200.0000 mg | ORAL_TABLET | Freq: Three times a day (TID) | ORAL | 0 refills | Status: DC

## 2020-11-06 NOTE — ED Triage Notes (Signed)
Patient complains of urinary urgency, cloudy and frequency with painful urination. Reports that yesterday.

## 2020-11-06 NOTE — ED Provider Notes (Signed)
[facility]    CSN: [id number] Arrival date & time: 11/06/20  1448      History   Chief Complaint Chief Complaint  Patient presents with  . Urinary Urgency    HPI Sheila Cole is a 60 y.o. female.   HPI   60 year old female here for evaluation of urinary urgency, frequency, pain with urination and cloudy urine that started yesterday.  Patient denies fever, blood in urine, nausea or vomiting, or back pain.  Past Medical History:  Diagnosis Date  . Adopted   . Anxiety   . Arthritis    lower back, neck, right knee  . Asthma    in past  . Atrophic vaginitis   . Complication of anesthesia   . Depression   . Headache    migraines - 1 every few months  . Hematuria   . Hypothyroid   . Incomplete emptying of bladder   . Lower back pain   . PONV (postoperative nausea and vomiting)   . Stone (use calc)     There are no problems to display for this patient.   Past Surgical History:  Procedure Laterality Date  . CERVICAL POLYPECTOMY    . COLON SURGERY    . INCONTINENCE SURGERY    . KNEE ARTHROSCOPY Right 01/24/2016   Procedure: ARTHROSCOPY KNEE RIGHT.Marland KitchenMarland Kitchen Arthroscopic partial lateral meniscectomy along with debridement and Coblation of the chondral lesions plus abrasion chondroplasty of the lateral tibial plateau lesion  ;  Surgeon: Erin Sons, MD;  Location: [facility];  Service: Orthopedics;  Laterality: Right;  . KNEE SURGERY    . TMJ ARTHROPLASTY      OB History   No obstetric history on file.      Home Medications    Prior to Admission medications   Medication Sig Start Date End Date Taking? Authorizing Provider  cephALEXin (KEFLEX) 500 MG capsule Take 1 capsule (500 mg total) by mouth 2 (two) times daily for 5 days. 11/06/20 11/11/20 Yes Becky Augusta, NP  levothyroxine (SYNTHROID, LEVOTHROID) 125 MCG tablet Take 150 mcg by mouth daily before breakfast.    Yes [provider]  phenazopyridine (PYRIDIUM) 200 MG tablet Take  1 tablet (200 mg total) by mouth 3 (three) times daily. 11/06/20  Yes Becky Augusta, NP  SUMAtriptan (IMITREX) 50 MG tablet Take 50 mg by mouth every 2 (two) hours as needed for migraine. May repeat in 2 hours if headache persists or recurs.   Yes [provider]  albuterol (VENTOLIN HFA) 108 (90 Base) MCG/ACT inhaler Inhale into the lungs. 10/16/20   [provider]  buPROPion (WELLBUTRIN SR) 150 MG 12 hr tablet Take 300 mg by mouth daily. AM  11/06/20  [provider]  mirtazapine (REMERON) 30 MG tablet Take 30 mg by mouth at bedtime.  11/06/20  [provider]  ranitidine (ZANTAC) 150 MG tablet Take 150 mg by mouth 2 (two) times daily.  11/06/20  [provider]  traZODone (DESYREL) 50 MG tablet Take 50 mg by mouth at bedtime.  11/06/20  [provider]    Family History Family History  Adopted: Yes    Social History Social History   Tobacco Use  . Smoking status: Never Smoker  . Smokeless tobacco: Never Used  Substance Use Topics  . Alcohol use: Yes    Alcohol/week: 2.0 standard drinks    Types: 2 Cans of beer per week    Comment: occasional  . Drug use: Never  Allergies   Bactrim [sulfamethoxazole-trimethoprim]   Review of Systems Review of Systems  Constitutional: Negative for activity change and fever.  Gastrointestinal: Negative for diarrhea, nausea and vomiting.  Genitourinary: Positive for dysuria, frequency and urgency. Negative for flank pain and hematuria.  Skin: Negative for rash.  Hematological: Negative.   Psychiatric/Behavioral: Negative.      Physical Exam Triage Vital Signs ED Triage Vitals  Enc Vitals Group     BP 11/06/20 1848 (!) 149/91     Pulse Rate 11/06/20 1848 71     Resp 11/06/20 1848 18     Temp 11/06/20 1848 98.3 F (36.8 C)     Temp Source 11/06/20 1848 Oral     SpO2 11/06/20 1848 99 %     Weight 11/06/20 1742 185 lb (83.9 kg)     Height 11/06/20 1742 5\' 10"  (1.778 m)     Head  Circumference --      Peak Flow --      Pain Score 11/06/20 1742 7     Pain Loc --      Pain Edu? --      Excl. in [place]? --    No data found.  Updated Vital Signs BP (!) 149/91 (BP Location: Left Arm)   Pulse 71   Temp 98.3 F (36.8 C) (Oral)   Resp 18   Ht 5\' 10"  (1.778 m)   Wt 185 lb (83.9 kg)   SpO2 99%   BMI 26.54 kg/m   Visual Acuity Right Eye Distance:   Left Eye Distance:   Bilateral Distance:    Right Eye Near:   Left Eye Near:    Bilateral Near:     Physical Exam Vitals and nursing note reviewed.  Constitutional:      General: She is not in acute distress.    Appearance: Normal appearance. She is obese.  HENT:     Head: Normocephalic and atraumatic.  Cardiovascular:     Rate and Rhythm: Normal rate and regular rhythm.     Pulses: Normal pulses.     Heart sounds: Normal heart sounds. No murmur heard. No gallop.   Pulmonary:     Effort: Pulmonary effort is normal.     Breath sounds: Normal breath sounds. No wheezing, rhonchi or rales.  Abdominal:     Tenderness: There is no right CVA tenderness or left CVA tenderness.  Skin:    General: Skin is warm and dry.     Capillary Refill: Capillary refill takes less than 2 seconds.     Findings: No erythema or rash.  Neurological:     General: No focal deficit present.     Mental Status: She is alert and oriented to person, place, and time.  Psychiatric:        Mood and Affect: Mood normal.        Behavior: Behavior normal.        Thought Content: Thought content normal.        Judgment: Judgment normal.      UC Treatments / Results  Labs (all labs ordered are listed, but only abnormal results are displayed) Labs Reviewed  URINALYSIS, COMPLETE (UACMP) WITH MICROSCOPIC - Abnormal; Notable for the following components:      Result Value   APPearance CLOUDY (*)    Hgb urine dipstick MODERATE (*)    Ketones, ur TRACE (*)    Protein, ur 100 (*)    Nitrite POSITIVE (*)    Leukocytes,Ua MODERATE (*)  Bacteria, UA MANY (*)    All other components within normal limits  URINE CULTURE    EKG   Radiology No results found.  Procedures Procedures (including critical care time)  Medications Ordered in UC Medications - No data to display  Initial Impression / Assessment and Plan / UC Course  I have reviewed the triage vital signs and the nursing notes.  Pertinent labs & imaging results that were available during my care of the patient were reviewed by me and considered in my medical decision making (see chart for details).   Patient presents for evaluation of UTI symptoms that started yesterday.  Patient is nontoxic in appearance.  Patient has no abdominal pain or CVA tenderness on exam.  Lung sounds are clear to auscultation in all fields.  Urinalysis shows moderate hemoglobin, trace ketones, 100 protein, nitrite positive, moderate leukocytes, greater than 50 WBCs and many bacteria.  Will send urine for culture and treat with Keflex 500 mg twice daily x5 days and Pyridium 200 mg every 8 hours.   Final Clinical Impressions(s) / UC Diagnoses   Final diagnoses:  Cystitis     Discharge Instructions     Keflex twice daily for 5 days.  Pyridium 3 times a day as needed for urinary discomfort.  Increase your oral water intake so that you increase your urine production and flush the urinary system.  If you develop any fever, back pain, nausea or vomiting and cannot keep fluids or medications down return for reevaluation or go to the ER.      ED Prescriptions    Medication Sig Dispense Auth. Provider   cephALEXin (KEFLEX) 500 MG capsule Take 1 capsule (500 mg total) by mouth 2 (two) times daily for 5 days. 10 capsule Becky Augusta, NP   phenazopyridine (PYRIDIUM) 200 MG tablet Take 1 tablet (200 mg total) by mouth 3 (three) times daily. 6 tablet Becky Augusta, NP     PDMP not reviewed this encounter.   Becky Augusta, NP 11/06/20 Windell Moment

## 2020-11-06 NOTE — Discharge Instructions (Addendum)
Keflex twice daily for 5 days.  Pyridium 3 times a day as needed for urinary discomfort.  Increase your oral water intake so that you increase your urine production and flush the urinary system.  If you develop any fever, back pain, nausea or vomiting and cannot keep fluids or medications down return for reevaluation or go to the ER.

## 2020-11-08 LAB — URINE CULTURE

## 2020-11-09 LAB — URINE CULTURE
Culture: 40000 — AB
Special Requests: NORMAL

## 2021-05-18 ENCOUNTER — Emergency Department: Payer: BC Managed Care – PPO

## 2021-05-18 ENCOUNTER — Ambulatory Visit
Admission: EM | Admit: 2021-05-18 | Discharge: 2021-05-18 | Disposition: A | Discharge status: Home / Self Care | Payer: BC Managed Care – PPO

## 2021-05-18 ENCOUNTER — Emergency Department
Admission: EM | Admit: 2021-05-18 | Discharge: 2021-05-18 | Disposition: A | Discharge status: Home / Self Care | Payer: BC Managed Care – PPO | Attending: Emergency Medicine | Admitting: Emergency Medicine

## 2021-05-18 ENCOUNTER — Other Ambulatory Visit: Payer: Self-pay

## 2021-05-18 DIAGNOSIS — R1012 Left upper quadrant pain: Secondary | ICD-10-CM | POA: Diagnosis not present

## 2021-05-18 DIAGNOSIS — N200 Calculus of kidney: Secondary | ICD-10-CM | POA: Diagnosis not present

## 2021-05-18 DIAGNOSIS — E039 Hypothyroidism, unspecified: Secondary | ICD-10-CM | POA: Insufficient documentation

## 2021-05-18 DIAGNOSIS — J45909 Unspecified asthma, uncomplicated: Secondary | ICD-10-CM | POA: Diagnosis not present

## 2021-05-18 DIAGNOSIS — Z79899 Other long term (current) drug therapy: Secondary | ICD-10-CM | POA: Diagnosis not present

## 2021-05-18 DIAGNOSIS — R1032 Left lower quadrant pain: Secondary | ICD-10-CM | POA: Diagnosis present

## 2021-05-18 DIAGNOSIS — Z7951 Long term (current) use of inhaled steroids: Secondary | ICD-10-CM | POA: Diagnosis not present

## 2021-05-18 LAB — CBC
HCT: 40.4 % (ref 36.0–46.0)
Hemoglobin: 13.3 g/dL (ref 12.0–15.0)
MCH: 31.1 pg (ref 26.0–34.0)
MCHC: 32.9 g/dL (ref 30.0–36.0)
MCV: 94.6 fL (ref 80.0–100.0)
Platelets: 223 10*3/uL (ref 150–400)
RBC: 4.27 MIL/uL (ref 3.87–5.11)
RDW: 12.8 % (ref 11.5–15.5)
WBC: 8.8 10*3/uL (ref 4.0–10.5)
nRBC: 0 % (ref 0.0–0.2)

## 2021-05-18 LAB — URINALYSIS, COMPLETE (UACMP) WITH MICROSCOPIC
Bilirubin Urine: NEGATIVE
Glucose, UA: NEGATIVE mg/dL
Hgb urine dipstick: NEGATIVE
Ketones, ur: NEGATIVE mg/dL
Nitrite: NEGATIVE
Protein, ur: NEGATIVE mg/dL
Specific Gravity, Urine: 1.027 (ref 1.005–1.030)
pH: 5 (ref 5.0–8.0)

## 2021-05-18 LAB — LIPASE, BLOOD: Lipase: 30 U/L (ref 11–51)

## 2021-05-18 LAB — COMPREHENSIVE METABOLIC PANEL
ALT: 24 U/L (ref 0–44)
AST: 30 U/L (ref 15–41)
Albumin: 4.2 g/dL (ref 3.5–5.0)
Alkaline Phosphatase: 61 U/L (ref 38–126)
Anion gap: 5 (ref 5–15)
BUN: 28 mg/dL — ABNORMAL HIGH (ref 6–20)
CO2: 27 mmol/L (ref 22–32)
Calcium: 9.4 mg/dL (ref 8.9–10.3)
Chloride: 107 mmol/L (ref 98–111)
Creatinine, Ser: 1.15 mg/dL — ABNORMAL HIGH (ref 0.44–1.00)
GFR, Estimated: 55 mL/min — ABNORMAL LOW (ref >60)
Glucose, Bld: 142 mg/dL — ABNORMAL HIGH (ref 70–99)
Potassium: 4.1 mmol/L (ref 3.5–5.1)
Sodium: 139 mmol/L (ref 135–145)
Total Bilirubin: 0.7 mg/dL (ref 0.3–1.2)
Total Protein: 7.2 g/dL (ref 6.5–8.1)

## 2021-05-18 MED ORDER — ONDANSETRON HCL 4 MG/2ML IJ SOLN
INTRAMUSCULAR | Status: AC
  Administered 2021-05-18: 4 mg via INTRAVENOUS
  Filled 2021-05-18: qty 2

## 2021-05-18 MED ORDER — OXYCODONE-ACETAMINOPHEN 5-325 MG PO TABS: 1.0000 | ORAL_TABLET | Freq: Three times a day (TID) | ORAL | 0 refills | Status: AC | PRN

## 2021-05-18 MED ORDER — KETOROLAC TROMETHAMINE 30 MG/ML IJ SOLN
30.0000 mg | Freq: Once | INTRAMUSCULAR | Status: AC
  Administered 2021-05-18: 30 mg via INTRAVENOUS
  Filled 2021-05-18: qty 1

## 2021-05-18 MED ORDER — ONDANSETRON 4 MG PO TBDP: 4.0000 mg | ORAL_TABLET | Freq: Three times a day (TID) | ORAL | 0 refills | Status: AC | PRN

## 2021-05-18 MED ORDER — TAMSULOSIN HCL 0.4 MG PO CAPS: 0.4000 mg | ORAL_CAPSULE | Freq: Every day | ORAL | 0 refills | Status: AC

## 2021-05-18 MED ORDER — NAPROXEN 500 MG PO TABS: 500.0000 mg | ORAL_TABLET | Freq: Two times a day (BID) | ORAL | 2 refills | Status: AC

## 2021-05-18 MED ORDER — IOHEXOL 300 MG/ML  SOLN
75.0000 mL | Freq: Once | INTRAMUSCULAR | Status: AC | PRN
  Administered 2021-05-18: 75 mL via INTRAVENOUS

## 2021-05-18 MED ORDER — ONDANSETRON HCL 4 MG/2ML IJ SOLN: 4.0000 mg | Freq: Once | INTRAMUSCULAR | Status: AC

## 2021-05-18 MED ORDER — FENTANYL CITRATE (PF) 100 MCG/2ML IJ SOLN
50.0000 ug | INTRAMUSCULAR | Status: AC | PRN
  Administered 2021-05-18 (×2): 50 ug via INTRAVENOUS
  Filled 2021-05-18 (×2): qty 2

## 2021-05-18 NOTE — ED Triage Notes (Signed)
Pt comes into the ED via [facility] from home c/o LLQ abd pain and N/V that started today.  Pt has had 3 episodes of emesis.  73 Hr, 98& RA, 140/74, CBG 152. 20g R. AC, 4mg  zofran given.

## 2021-05-18 NOTE — ED Triage Notes (Signed)
Pt c/o waking up with severe left-sided abdominal pain this morning. Pt also reports some diarrhea and nausea, denies emesis. Pt denies any urinary symptoms. Pt denies any colon disease hx or gallbladder issues.

## 2021-05-18 NOTE — ED Notes (Signed)
Patient is being discharged from the Urgent Care and sent to the [facility] Emergency Department via private vehicle . Per Becky Augusta, NP, patient is in need of higher level of care due to severe abdominal pain and further testing. Patient is aware and verbalizes understanding of plan of care.  Vitals:   05/18/21 1003  BP: (!) 145/85  Pulse: 67  Resp: 18  Temp: 97.7 F (36.5 C)  SpO2: 98%

## 2021-05-18 NOTE — ED Provider Notes (Signed)
[facility] Emergency Department Provider Note   ____________________________________________    I have reviewed the triage vital signs and the nursing notes.   HISTORY  Chief Complaint Abdominal Pain     HPI Sheila Cole is a 60 y.o. female who presents with left lower quadrant abdominal pain which she describes as moderate to severe and sharp in nature.  She denies fevers or chills.  Has not had nausea and vomiting.  Reports symptoms started around 7 AM this morning.  Went to urgent care and was referred to the emergency department.  No history of kidney stones reported.  No history of diverticulitis.  Has not take anything for this minimal improvement with IV fentanyl given in triage  Past Medical History:  Diagnosis Date   Adopted    Anxiety    Arthritis    lower back, neck, right knee   Asthma    in past   Atrophic vaginitis    Complication of anesthesia    Depression    Headache    migraines - 1 every few months   Hematuria    Hypothyroid    Incomplete emptying of bladder    Lower back pain    PONV (postoperative nausea and vomiting)    Stone (use calc)     There are no problems to display for this patient.   Past Surgical History:  Procedure Laterality Date   CERVICAL POLYPECTOMY     COLON SURGERY     INCONTINENCE SURGERY     KNEE ARTHROSCOPY Right 01/24/2016   Procedure: ARTHROSCOPY KNEE RIGHT.Marland KitchenMarland Kitchen Arthroscopic partial lateral meniscectomy along with debridement and Coblation of the chondral lesions plus abrasion chondroplasty of the lateral tibial plateau lesion  ;  Surgeon: Erin Sons, MD;  Location: [facility];  Service: Orthopedics;  Laterality: Right;   KNEE SURGERY     TMJ ARTHROPLASTY      Prior to Admission medications   Medication Sig Start Date End Date Taking? Authorizing Provider  naproxen (NAPROSYN) 500 MG tablet Take 1 tablet (500 mg total) by mouth 2 (two) times daily with a meal. 05/18/21  Yes  Jene Every, MD  ondansetron (ZOFRAN ODT) 4 MG disintegrating tablet Take 1 tablet (4 mg total) by mouth every 8 (eight) hours as needed. 05/18/21  Yes Jene Every, MD  oxyCODONE-acetaminophen (PERCOCET) 5-325 MG tablet Take 1 tablet by mouth every 8 (eight) hours as needed for severe pain. 05/18/21 05/18/22 Yes Jene Every, MD  tamsulosin (FLOMAX) 0.4 MG CAPS capsule Take 1 capsule (0.4 mg total) by mouth daily. 05/18/21  Yes Jene Every, MD  albuterol (VENTOLIN HFA) 108 (90 Base) MCG/ACT inhaler Inhale into the lungs. 10/16/20   [provider]  estradiol (ESTRACE) 0.1 MG/GM vaginal cream INSERT 4 CLICKS(=1ML) VAGINALLY TWICE A WEEK - REFILLS 48HR NOTICE - CUSTOM COMPOUND 12/16/20   [provider]  levothyroxine (SYNTHROID, LEVOTHROID) 125 MCG tablet Take 150 mcg by mouth daily before breakfast.     [provider]  SUMAtriptan (IMITREX) 50 MG tablet Take 50 mg by mouth every 2 (two) hours as needed for migraine. May repeat in 2 hours if headache persists or recurs.    [provider]  buPROPion (WELLBUTRIN SR) 150 MG 12 hr tablet Take 300 mg by mouth daily. AM  11/06/20  [provider]  mirtazapine (REMERON) 30 MG tablet Take 30 mg by mouth at bedtime.  11/06/20  [provider]  ranitidine (ZANTAC) 150 MG tablet Take  150 mg by mouth 2 (two) times daily.  11/06/20  [provider]  traZODone (DESYREL) 50 MG tablet Take 50 mg by mouth at bedtime.  11/06/20  [provider]     Allergies Sulfamethoxazole-trimethoprim  Family History  Adopted: Yes    Social History Social History   Tobacco Use   Smoking status: Never   Smokeless tobacco: Never  Vaping Use   Vaping Use: Never used  Substance Use Topics   Alcohol use: Yes    Alcohol/week: 2.0 standard drinks    Types: 2 Cans of beer per week    Comment: occasional   Drug use: Never    Review of Systems  Constitutional: No fever/chills Eyes: No visual  changes.  ENT: No sore throat. Cardiovascular: Denies chest pain. Respiratory: Denies shortness of breath. Gastrointestinal: As above Genitourinary: No dysuria Musculoskeletal: Negative for back pain. Skin: Negative for rash. Neurological: Negative for headaches or weakness   ____________________________________________   PHYSICAL EXAM:  VITAL SIGNS: ED Triage Vitals  Enc Vitals Group     BP 05/18/21 1158 (!) 146/87     Pulse Rate 05/18/21 1158 71     Resp 05/18/21 1158 20     Temp 05/18/21 1212 98.3 F (36.8 C)     Temp Source 05/18/21 1158 Oral     SpO2 05/18/21 1158 98 %     Weight 05/18/21 1159 79.4 kg (175 lb)     Height 05/18/21 1159 1.778 m (5\' 10" )     Head Circumference --      Peak Flow --      Pain Score 05/18/21 1159 10     Pain Loc --      Pain Edu? --      Excl. in [place]? --     Constitutional: Alert and oriented.   Nose: No congestion/rhinnorhea.  Cardiovascular: Normal rate, regular rhythm. Grossly normal heart sounds.  Good peripheral circulation. Respiratory: Normal respiratory effort.  No retractions. Lungs CTAB. Gastrointestinal: Soft, mild left lower quadrant tenderness palpation no distention.  Mild left CVA tenderness  Musculoskeletal: Warm and well perfused Neurologic:  Normal speech and language. No gross focal neurologic deficits are appreciated.  Skin:  Skin is warm, dry and intact. No rash noted. Psychiatric: Mood and affect are normal. Speech and behavior are normal.  ____________________________________________   LABS (all labs ordered are listed, but only abnormal results are displayed)  Labs Reviewed  COMPREHENSIVE METABOLIC PANEL - Abnormal; Notable for the following components:      Result Value   Glucose, Bld 142 (*)    BUN 28 (*)    Creatinine, Ser 1.15 (*)    GFR, Estimated 55 (*)    All other components within normal limits  URINALYSIS, COMPLETE (UACMP) WITH MICROSCOPIC - Abnormal; Notable for the following components:    Color, Urine YELLOW (*)    APPearance HAZY (*)    Leukocytes,Ua MODERATE (*)    Bacteria, UA RARE (*)    All other components within normal limits  LIPASE, BLOOD  CBC   ____________________________________________  EKG  None ____________________________________________  RADIOLOGY  CT abdomen pelvis reviewed by me, left perinephric stranding ____________________________________________   PROCEDURES  Procedure(s) performed: No  Procedures   Critical Care performed: No ____________________________________________   INITIAL IMPRESSION / ASSESSMENT AND PLAN / ED COURSE  Pertinent labs & imaging results that were available during my care of the patient were reviewed by me and considered in my medical decision making (see chart for details).  Patient presents with left lower quadrant abdominal pain as detailed above.  Suspicious for ureterolithiasis given abrupt onset, diverticulitis, colitis also on the differential.  Urinalysis without evidence of urinary tract infection, no hematuria noted.  Minimal improvement with fentanyl we will try Toradol and send for CT abdomen pelvis  Significant improvement with IV Toradol  CT scan demonstrates 2 mm distally ureteral stone  Discussed with patient return precautions including fever or symptoms of UTI.  Outpatient follow-up, she has a urologist she sees regularly, she is on Macrobid chronically    ____________________________________________   FINAL CLINICAL IMPRESSION(S) / ED DIAGNOSES  Final diagnoses:  Kidney stone        Note:  This document was prepared using Dragon voice recognition software and may include unintentional dictation errors.    Jene Every, MD 05/18/21 [phone]

## 2021-05-18 NOTE — ED Provider Notes (Signed)
[facility]    CSN: [id number] Arrival date & time: 05/18/21  0945      History   Chief Complaint Chief Complaint  Patient presents with   Abdominal Pain    ULQ    HPI Sheila Cole is a 60 y.o. female.   HPI  60 year old female here for evaluation of left-sided abdominal pain.  Patient reports that she was awoken from sleep about 7 AM with sharp left-sided abdominal pain.  The pain is in her upper left abdomen and she is rating it as a 10 out of 10 and constant.  She states she has had 3 bowel movements today with the last one being runny.  She has not seen any blood in her stool and she denies fever.  Patient has a long history of urinary tract infections but she denies any painful urination, urinary urgency or frequency, or blood in her urine.  Patient also has documented colon surgery history but she denies having any history of colon surgery.  She also denies ever having a colonoscopy and she is unaware of having any diverticulitis or diverticulosis.  Past Medical History:  Diagnosis Date   Adopted    Anxiety    Arthritis    lower back, neck, right knee   Asthma    in past   Atrophic vaginitis    Complication of anesthesia    Depression    Headache    migraines - 1 every few months   Hematuria    Hypothyroid    Incomplete emptying of bladder    Lower back pain    PONV (postoperative nausea and vomiting)    Stone (use calc)     There are no problems to display for this patient.   Past Surgical History:  Procedure Laterality Date   CERVICAL POLYPECTOMY     COLON SURGERY     INCONTINENCE SURGERY     KNEE ARTHROSCOPY Right 01/24/2016   Procedure: ARTHROSCOPY KNEE RIGHT.Marland KitchenMarland Kitchen Arthroscopic partial lateral meniscectomy along with debridement and Coblation of the chondral lesions plus abrasion chondroplasty of the lateral tibial plateau lesion  ;  Surgeon: Erin Sons, MD;  Location: [facility];  Service: Orthopedics;  Laterality: Right;    KNEE SURGERY     TMJ ARTHROPLASTY      OB History   No obstetric history on file.      Home Medications    Prior to Admission medications   Medication Sig Start Date End Date Taking? Authorizing Provider  albuterol (VENTOLIN HFA) 108 (90 Base) MCG/ACT inhaler Inhale into the lungs. 10/16/20  Yes [provider]  estradiol (ESTRACE) 0.1 MG/GM vaginal cream INSERT 4 CLICKS(=1ML) VAGINALLY TWICE A WEEK - REFILLS 48HR NOTICE - CUSTOM COMPOUND 12/16/20  Yes [provider]  levothyroxine (SYNTHROID, LEVOTHROID) 125 MCG tablet Take 150 mcg by mouth daily before breakfast.    Yes [provider]  SUMAtriptan (IMITREX) 50 MG tablet Take 50 mg by mouth every 2 (two) hours as needed for migraine. May repeat in 2 hours if headache persists or recurs.   Yes [provider]  phenazopyridine (PYRIDIUM) 200 MG tablet Take 1 tablet (200 mg total) by mouth 3 (three) times daily. 11/06/20   Becky Augusta, NP  buPROPion [facility] SR) 150 MG 12 hr tablet Take 300 mg by mouth daily. AM  11/06/20  [provider]  mirtazapine (REMERON) 30 MG tablet Take 30 mg by mouth at bedtime.  11/06/20  [provider]  ranitidine (  ZANTAC) 150 MG tablet Take 150 mg by mouth 2 (two) times daily.  11/06/20  [provider]  traZODone (DESYREL) 50 MG tablet Take 50 mg by mouth at bedtime.  11/06/20  [provider]    Family History Family History  Adopted: Yes    Social History Social History   Tobacco Use   Smoking status: Never   Smokeless tobacco: Never  Vaping Use   Vaping Use: Never used  Substance Use Topics   Alcohol use: Yes    Alcohol/week: 2.0 standard drinks    Types: 2 Cans of beer per week    Comment: occasional   Drug use: Never     Allergies   Sulfamethoxazole-trimethoprim   Review of Systems Review of Systems  Constitutional:  Negative for activity change, appetite change and fever.  Gastrointestinal:  Positive for  abdominal pain, diarrhea and nausea. Negative for anal bleeding, blood in stool and vomiting.  Genitourinary:  Negative for dysuria, flank pain, frequency, hematuria and urgency.    Physical Exam Triage Vital Signs ED Triage Vitals  Enc Vitals Group     BP 05/18/21 1003 (!) 145/85     Pulse Rate 05/18/21 1003 67     Resp 05/18/21 1003 18     Temp 05/18/21 1003 97.7 F (36.5 C)     Temp Source 05/18/21 1003 Oral     SpO2 05/18/21 1003 98 %     Weight 05/18/21 1001 175 lb (79.4 kg)     Height 05/18/21 1001 5\' 10"  (1.778 m)     Head Circumference --      Peak Flow --      Pain Score 05/18/21 1001 10     Pain Loc --      Pain Edu? --      Excl. in GC? --    No data found.  Updated Vital Signs BP (!) 145/85 (BP Location: Left Arm)   Pulse 67   Temp 97.7 F (36.5 C) (Oral)   Resp 18   Ht 5\' 10"  (1.778 m)   Wt 175 lb (79.4 kg)   SpO2 98%   BMI 25.11 kg/m   Visual Acuity Right Eye Distance:   Left Eye Distance:   Bilateral Distance:    Right Eye Near:   Left Eye Near:    Bilateral Near:     Physical Exam Vitals and nursing note reviewed.  Constitutional:      General: She is in acute distress.     Appearance: She is well-developed and normal weight. She is ill-appearing.  HENT:     Head: Normocephalic and atraumatic.  Eyes:     General: No scleral icterus. Cardiovascular:     Rate and Rhythm: Normal rate and regular rhythm.     Heart sounds: Normal heart sounds. No murmur heard.   No gallop.  Pulmonary:     Effort: Pulmonary effort is normal.     Breath sounds: Normal breath sounds. No wheezing, rhonchi or rales.  Abdominal:     General: Abdomen is flat. Bowel sounds are normal. There is no distension.     Palpations: Abdomen is soft. There is no hepatomegaly or splenomegaly.     Tenderness: There is abdominal tenderness in the epigastric area and left lower quadrant. There is no right CVA tenderness, left CVA tenderness, guarding or rebound.  Skin:     General: Skin is warm and dry.     Capillary Refill: Capillary refill takes less than 2 seconds.  Findings: No rash.  Neurological:     General: No focal deficit present.     Mental Status: She is alert and oriented to person, place, and time.  Psychiatric:        Mood and Affect: Mood normal.        Behavior: Behavior normal.     UC Treatments / Results  Labs (all labs ordered are listed, but only abnormal results are displayed) Labs Reviewed - No data to display  EKG   Radiology No results found.  Procedures Procedures (including critical care time)  Medications Ordered in UC Medications - No data to display  Initial Impression / Assessment and Plan / UC Course  I have reviewed the triage vital signs and the nursing notes.  Pertinent labs & imaging results that were available during my care of the patient were reviewed by me and considered in my medical decision making (see chart for details).  Patient is an ill-appearing 60 year old female who appears to be in a moderate amount of pain here for evaluation of left-sided abdominal pain that awoke her abruptly from sleep at 7 AM.  The pain is rated as 10/10 and it is sharp and constant.  Patient states she is never had anything like this.  She does not have any associated urinary symptoms though has a history of chronic UTIs.  Patient's physical exam reveals a benign cardiopulmonary exam with clear lung sounds in all fields.  Abdomen is soft with positive bowel sounds all 4 quadrants.  Patient has significant left upper quadrant tenderness and more mild epigastric and left lower quadrant tenderness.  No right lower quadrant, periumbilical, or right upper quadrant tenderness noted on exam.  Patient does not have any CVA tenderness on exam.  Due to the extreme nature of the patient's pain being rated 10/10, the lack of laboratory resources and imaging services available in the weekend I have recommended that the patient go to the  emergency department for evaluation.  She is in agreement and she is elected to go to [facility].  Patient was discharged in stable condition to travel to the emergency department via POV.   Final Clinical Impressions(s) / UC Diagnoses   Final diagnoses:  Left upper quadrant abdominal pain     Discharge Instructions      As we discussed, I am unable to fully evaluate your source of abdominal pain as we have limited laboratory services and imaging services here at the urgent care.  I also do not have any medication to control your pain here at the urgent care.  Please go to the emergency department at [facility] for evaluation and definitive treatment of your pain.     ED Prescriptions   None    PDMP not reviewed this encounter.   Becky Augusta, NP 05/18/21 1028

## 2021-05-18 NOTE — Discharge Instructions (Addendum)
As we discussed, I am unable to fully evaluate your source of abdominal pain as we have limited laboratory services and imaging services here at the urgent care.  I also do not have any medication to control your pain here at the urgent care.  Please go to the emergency department at [facility] for evaluation and definitive treatment of your pain.

## 2021-05-18 NOTE — ED Notes (Signed)
All questions answered, follow up pcp, rx sent to pharmacy

## 2021-06-17 LAB — COLOGUARD: COLOGUARD: NEGATIVE

## 2021-07-31 ENCOUNTER — Encounter: Payer: Self-pay | Admitting: Podiatry

## 2021-08-05 ENCOUNTER — Other Ambulatory Visit: Payer: Self-pay | Admitting: Podiatry

## 2021-08-12 NOTE — Discharge Instructions (Addendum)
[facility]  POST OPERATIVE INSTRUCTIONS FOR DR. FOWLER AND [facility]   Take your medication as prescribed.  Pain medication should be taken only as needed.  Keep the dressing clean, dry and intact.  Keep your foot elevated above the heart level for the first 48 hours.  Walking to the bathroom and brief periods of walking are acceptable, unless we have instructed you to be non-weight bearing.  Always wear your post-op shoe when walking.  Always use your crutches if you are to be non-weight bearing.  Do not take a shower. Baths are permissible as long as the foot is kept out of the water.   Every hour you are awake:  Bend your knee 15 times. Flex foot 15 times Massage calf 15 times  Call [facility] ([phone]) if any of the following problems occur: You develop a temperature or fever. The bandage becomes saturated with blood. Medication does not stop your pain. Injury of the foot occurs. Any symptoms of infection including redness, odor, or red streaks running from wound.  Information for Discharge Teaching: EXPAREL (bupivacaine liposome injectable suspension)   Your surgeon or anesthesiologist gave you EXPAREL(bupivacaine) to help control your pain after surgery.  EXPAREL is a local anesthetic that provides pain relief by numbing the tissue around the surgical site. EXPAREL is designed to release pain medication over time and can control pain for up to 72 hours. Depending on how you respond to EXPAREL, you may require less pain medication during your recovery.  Possible side effects: Temporary loss of sensation or ability to move in the area where bupivacaine was injected. Nausea, vomiting, constipation Rarely, numbness and tingling in your mouth or lips, lightheadedness, or anxiety may occur. Call your doctor right away if you think you may be experiencing any of these sensations, or if  you have other questions regarding possible side effects.  Follow all other discharge instructions given to you by your surgeon or nurse. Eat a healthy diet and drink plenty of water or other fluids.  If you return to the hospital for any reason within 96 hours following the administration of EXPAREL, it is important for health care providers to know that you have received this anesthetic. A teal colored band has been placed on your arm with the date, time and amount of EXPAREL you have received in order to alert and inform your health care providers. Please leave this armband in place for the full 96 hours following administration, and then you may remove the band.   

## 2021-08-13 ENCOUNTER — Ambulatory Visit: Payer: BC Managed Care – PPO | Admitting: Anesthesiology

## 2021-08-13 ENCOUNTER — Ambulatory Visit
Admission: RE | Admit: 2021-08-13 | Discharge: 2021-08-13 | Disposition: A | Discharge status: Home / Self Care | Payer: BC Managed Care – PPO | Attending: Podiatry | Admitting: Podiatry

## 2021-08-13 ENCOUNTER — Other Ambulatory Visit: Payer: Self-pay

## 2021-08-13 ENCOUNTER — Encounter
Admission: RE | Disposition: A | Discharge status: Home / Self Care | Payer: Self-pay | Source: Home / Self Care | Attending: Podiatry

## 2021-08-13 ENCOUNTER — Encounter: Payer: Self-pay | Admitting: Podiatry

## 2021-08-13 DIAGNOSIS — M1990 Unspecified osteoarthritis, unspecified site: Secondary | ICD-10-CM | POA: Diagnosis not present

## 2021-08-13 DIAGNOSIS — E039 Hypothyroidism, unspecified: Secondary | ICD-10-CM | POA: Diagnosis not present

## 2021-08-13 DIAGNOSIS — Z6825 Body mass index (BMI) 25.0-25.9, adult: Secondary | ICD-10-CM | POA: Insufficient documentation

## 2021-08-13 DIAGNOSIS — M2012 Hallux valgus (acquired), left foot: Secondary | ICD-10-CM | POA: Insufficient documentation

## 2021-08-13 DIAGNOSIS — M21612 Bunion of left foot: Secondary | ICD-10-CM | POA: Insufficient documentation

## 2021-08-13 DIAGNOSIS — E669 Obesity, unspecified: Secondary | ICD-10-CM | POA: Insufficient documentation

## 2021-08-13 DIAGNOSIS — J45909 Unspecified asthma, uncomplicated: Secondary | ICD-10-CM | POA: Diagnosis not present

## 2021-08-13 HISTORY — PX: WEIL OSTEOTOMY: SHX5044

## 2021-08-13 HISTORY — PX: BUNIONECTOMY: SHX129

## 2021-08-13 HISTORY — DX: Personal history of urinary calculi: Z87.442

## 2021-08-13 HISTORY — DX: Motion sickness, initial encounter: T75.3XXA

## 2021-08-13 SURGERY — BUNIONECTOMY
Anesthesia: General | Site: Toe | Laterality: Left

## 2021-08-13 MED ORDER — SCOPOLAMINE 1 MG/3DAYS TD PT72
1.0000 | MEDICATED_PATCH | TRANSDERMAL | Status: DC
  Administered 2021-08-13: 1.5 mg via TRANSDERMAL

## 2021-08-13 MED ORDER — BUPIVACAINE LIPOSOME 1.3 % IJ SUSP
INTRAMUSCULAR | Status: DC | PRN
  Administered 2021-08-13: 10 mL
  Administered 2021-08-13: 5 mL

## 2021-08-13 MED ORDER — LIDOCAINE HCL (CARDIAC) PF 100 MG/5ML IV SOSY
PREFILLED_SYRINGE | INTRAVENOUS | Status: DC | PRN
  Administered 2021-08-13: 60 mg via INTRATRACHEAL

## 2021-08-13 MED ORDER — OXYCODONE HCL 5 MG/5ML PO SOLN: 5.0000 mg | Freq: Once | ORAL | Status: DC | PRN

## 2021-08-13 MED ORDER — OXYCODONE-ACETAMINOPHEN 5-325 MG PO TABS: 1.0000 | ORAL_TABLET | Freq: Four times a day (QID) | ORAL | 0 refills | Status: AC | PRN

## 2021-08-13 MED ORDER — ONDANSETRON HCL 4 MG/2ML IJ SOLN: 4.0000 mg | Freq: Once | INTRAMUSCULAR | Status: DC | PRN

## 2021-08-13 MED ORDER — PROPOFOL 10 MG/ML IV BOLUS
INTRAVENOUS | Status: DC | PRN
  Administered 2021-08-13: 150 mg via INTRAVENOUS
  Administered 2021-08-13: 30 mg via INTRAVENOUS

## 2021-08-13 MED ORDER — OXYCODONE HCL 5 MG PO TABS: 5.0000 mg | ORAL_TABLET | Freq: Once | ORAL | Status: DC | PRN

## 2021-08-13 MED ORDER — LACTATED RINGERS IV SOLN: INTRAVENOUS | Status: DC

## 2021-08-13 MED ORDER — FENTANYL CITRATE (PF) 100 MCG/2ML IJ SOLN
INTRAMUSCULAR | Status: DC | PRN
  Administered 2021-08-13 (×4): 25 ug via INTRAVENOUS

## 2021-08-13 MED ORDER — METOCLOPRAMIDE HCL 5 MG/ML IJ SOLN: 5.0000 mg | Freq: Three times a day (TID) | INTRAMUSCULAR | Status: DC | PRN

## 2021-08-13 MED ORDER — ONDANSETRON HCL 4 MG/2ML IJ SOLN
INTRAMUSCULAR | Status: DC | PRN
  Administered 2021-08-13: 4 mg via INTRAVENOUS

## 2021-08-13 MED ORDER — KETOROLAC TROMETHAMINE 30 MG/ML IJ SOLN: 15.0000 mg | Freq: Once | INTRAMUSCULAR | Status: DC | PRN

## 2021-08-13 MED ORDER — BUPIVACAINE HCL (PF) 0.25 % IJ SOLN
INTRAMUSCULAR | Status: DC | PRN
  Administered 2021-08-13: 5 mL
  Administered 2021-08-13: 10 mL
  Administered 2021-08-13: 5 mL
  Administered 2021-08-13: 6 mL
  Administered 2021-08-13: 4 mL

## 2021-08-13 MED ORDER — ONDANSETRON HCL 4 MG PO TABS: 4.0000 mg | ORAL_TABLET | Freq: Four times a day (QID) | ORAL | Status: DC | PRN

## 2021-08-13 MED ORDER — FENTANYL CITRATE PF 50 MCG/ML IJ SOSY: 25.0000 ug | PREFILLED_SYRINGE | INTRAMUSCULAR | Status: DC | PRN

## 2021-08-13 MED ORDER — 0.9 % SODIUM CHLORIDE (POUR BTL) OPTIME
TOPICAL | Status: DC | PRN
  Administered 2021-08-13: 500 mL

## 2021-08-13 MED ORDER — MIDAZOLAM HCL 5 MG/5ML IJ SOLN
INTRAMUSCULAR | Status: DC | PRN
Start: 2021-08-13 — End: 2021-08-13
  Administered 2021-08-13: 2 mg via INTRAVENOUS

## 2021-08-13 MED ORDER — ONDANSETRON HCL 4 MG/2ML IJ SOLN: 4.0000 mg | Freq: Four times a day (QID) | INTRAMUSCULAR | Status: DC | PRN

## 2021-08-13 MED ORDER — PROPOFOL 500 MG/50ML IV EMUL
INTRAVENOUS | Status: DC | PRN
  Administered 2021-08-13: 160 ug/kg/min via INTRAVENOUS

## 2021-08-13 MED ORDER — POVIDONE-IODINE 7.5 % EX SOLN: Freq: Once | CUTANEOUS | Status: DC

## 2021-08-13 MED ORDER — CEFAZOLIN SODIUM-DEXTROSE 2-4 GM/100ML-% IV SOLN
2.0000 g | INTRAVENOUS | Status: AC
  Administered 2021-08-13: 2 g via INTRAVENOUS

## 2021-08-13 MED ORDER — DEXAMETHASONE SODIUM PHOSPHATE 4 MG/ML IJ SOLN
INTRAMUSCULAR | Status: DC | PRN
  Administered 2021-08-13: 6 mg via INTRAVENOUS

## 2021-08-13 MED ORDER — METOCLOPRAMIDE HCL 5 MG PO TABS: 5.0000 mg | ORAL_TABLET | Freq: Three times a day (TID) | ORAL | Status: DC | PRN

## 2021-08-13 SURGICAL SUPPLY — 51 items
APL SKNCLS STERI-STRIP NONHPOA (GAUZE/BANDAGES/DRESSINGS) ×2
BENZOIN TINCTURE PRP APPL 2/3 (GAUZE/BANDAGES/DRESSINGS) ×3 IMPLANT
BLADE OSC/SAGITTAL MD 5.5X18 (BLADE) ×3 IMPLANT
BLADE SAW LAPIPLASTY 40X11 (BLADE) ×3 IMPLANT
BLADE SURG 15 STRL LF DISP TIS (BLADE) ×2 IMPLANT
BLADE SURG 15 STRL SS (BLADE) ×3
BNDG CMPR 75X41 PLY HI ABS (GAUZE/BANDAGES/DRESSINGS) ×2
BNDG ELASTIC 4X5.8 VLCR STR LF (GAUZE/BANDAGES/DRESSINGS) ×3 IMPLANT
BNDG ESMARK 4X12 TAN STRL LF (GAUZE/BANDAGES/DRESSINGS) ×3 IMPLANT
BNDG STRETCH 4X75 STRL LF (GAUZE/BANDAGES/DRESSINGS) ×3 IMPLANT
CANISTER SUCT 1200ML W/VALVE (MISCELLANEOUS) ×3 IMPLANT
CNTRSNK DRL 2 HDLS SCR (MISCELLANEOUS) ×2 IMPLANT
COUNTERSINK 2.0 (MISCELLANEOUS) ×3
COVER LIGHT HANDLE UNIVERSAL (MISCELLANEOUS) ×6 IMPLANT
CUFF TOURN SGL QUICK 18X4 (TOURNIQUET CUFF) ×3 IMPLANT
DRAPE FLUOR MINI C-ARM 54X84 (DRAPES) ×3 IMPLANT
DURAPREP 26ML APPLICATOR (WOUND CARE) ×3 IMPLANT
ELECT REM PT RETURN 9FT ADLT (ELECTROSURGICAL) ×3
ELECTRODE REM PT RTRN 9FT ADLT (ELECTROSURGICAL) ×2 IMPLANT
GAUZE SPONGE 4X4 12PLY STRL (GAUZE/BANDAGES/DRESSINGS) ×3 IMPLANT
GAUZE XEROFORM 1X8 LF (GAUZE/BANDAGES/DRESSINGS) ×3 IMPLANT
GLOVE SRG 8 PF TXTR STRL LF DI (GLOVE) ×2 IMPLANT
GLOVE SURG ENC MOIS LTX SZ7.5 (GLOVE) ×3 IMPLANT
GLOVE SURG UNDER POLY LF SZ8 (GLOVE) ×3
GOWN STRL REUS W/ TWL LRG LVL3 (GOWN DISPOSABLE) ×4 IMPLANT
GOWN STRL REUS W/TWL LRG LVL3 (GOWN DISPOSABLE) ×6
K-WIRE DBL END TROCAR 6X.045 (WIRE) ×3
K-Wire 1.25mmx150mm (Wire) ×3 IMPLANT
KIT TURNOVER KIT A (KITS) ×3 IMPLANT
KWIRE DBL END TROCAR 6X.045 (WIRE) ×2 IMPLANT
LAPIPLASTY SYS 4A (Orthopedic Implant) ×3 IMPLANT
NS IRRIG 500ML POUR BTL (IV SOLUTION) ×3 IMPLANT
PACK EXTREMITY ARMC (MISCELLANEOUS) ×3 IMPLANT
PENCIL SMOKE EVACUATOR (MISCELLANEOUS) ×3 IMPLANT
RASP SM TEAR CROSS CUT (RASP) ×3 IMPLANT
SCREW 2.7 HIGH PITCH LOCKING (Screw) ×3 IMPLANT
SCREW CANN SHORT THRD 2.0X8 (Screw) ×3 IMPLANT
SCREW HEADLESS SHRT THRD 2X10 (Screw) ×3 IMPLANT
SCREW HIGH PITCH LOCK 2.7 (Screw) ×3 IMPLANT
STOCKINETTE IMPERVIOUS LG (DRAPES) ×3 IMPLANT
STRIP CLOSURE SKIN 1/4X4 (GAUZE/BANDAGES/DRESSINGS) ×3 IMPLANT
SUT ETHILON 3-0 (SUTURE) ×3 IMPLANT
SUT MNCRL 4-0 (SUTURE) ×3
SUT MNCRL 4-0 27XMFL (SUTURE) ×2
SUT VIC AB 3-0 SH 27 (SUTURE) ×3
SUT VIC AB 3-0 SH 27X BRD (SUTURE) ×2 IMPLANT
SUT VIC AB 4-0 SH 27 (SUTURE) ×6
SUT VIC AB 4-0 SH 27XANBCTRL (SUTURE) ×4 IMPLANT
SUTURE MNCRL 4-0 27XMF (SUTURE) ×2 IMPLANT
SYSTEM LAPIPLASTY 4A (Orthopedic Implant) ×2 IMPLANT
WIRE SMOOTH TROCAR .9MMX150MML (WIRE) ×3 IMPLANT

## 2021-08-13 NOTE — Anesthesia Preprocedure Evaluation (Signed)
Anesthesia Evaluation  Patient identified by MRN, date of birth, ID band Patient awake    Reviewed: Allergy & Precautions, H&P , NPO status , Patient's Chart, lab work & pertinent test results, reviewed documented beta blocker date and time   History of Anesthesia Complications (+) PONV and history of anesthetic complications  Airway Mallampati: II  TM Distance: >3 FB Neck ROM: full    Dental no notable dental hx.    Pulmonary asthma ,    Pulmonary exam normal breath sounds clear to auscultation       Cardiovascular Exercise Tolerance: Good negative cardio ROS Normal cardiovascular exam Rhythm:regular Rate:Normal     Neuro/Psych  Headaches, PSYCHIATRIC DISORDERS Anxiety Depression    GI/Hepatic negative GI ROS, Neg liver ROS,   Endo/Other  Hypothyroidism   Renal/GU Renal disease  negative genitourinary   Musculoskeletal  (+) Arthritis ,   Abdominal Normal abdominal exam  (+) - obese,  Abdomen: soft.    Peds  Hematology negative hematology ROS (+)   Anesthesia Other Findings   Reproductive/Obstetrics negative OB ROS                             Anesthesia Physical  Anesthesia Plan  ASA: II  Anesthesia Plan: General LMA   Post-op Pain Management:    Induction:   PONV Risk Score and Plan: 4 or greater and Treatment may vary due to age or medical condition, TIVA, Midazolam, Ondansetron, Dexamethasone and Scopolamine patch - Pre-op  Airway Management Planned: LMA  Additional Equipment:   Intra-op Plan:   Post-operative Plan:   Informed Consent: I have reviewed the patients History and Physical, chart, labs and discussed the procedure including the risks, benefits and alternatives for the proposed anesthesia with the patient or authorized representative who has indicated his/her understanding and acceptance.     Dental advisory given  Plan Discussed with:  CRNA  Anesthesia Plan Comments: (Patient has severe, refractory PONV. Will try TIVA as she has never had this technique in addition to IV antiemetics and a scopolamine patch.)        Anesthesia Quick Evaluation  There are no problems to display for this patient.   CBC Latest Ref Rng & Units 05/18/2021 02/26/2014 02/26/2013  WBC 4.0 - 10.5 K/uL 8.8 5.4 7.0  Hemoglobin 12.0 - 15.0 g/dL 22.4 82.5 00.3  Hematocrit 36.0 - 46.0 % 40.4 40.5 39.8  Platelets 150 - 400 K/uL 223 181 238   BMP Latest Ref Rng & Units 05/18/2021 02/26/2014 02/26/2013  Glucose 70 - 99 mg/dL 704(U) 91 91  BUN 6 - 20 mg/dL 88(B) 16(X) 45(W)  Creatinine 0.44 - 1.00 mg/dL 3.88(E) 2.80 0.34  Sodium 135 - 145 mmol/L 139 141 137  Potassium 3.5 - 5.1 mmol/L 4.1 4.2 4.2  Chloride 98 - 111 mmol/L 107 107 106  CO2 22 - 32 mmol/L 27 31 26   Calcium 8.9 - 10.3 mg/dL 9.4 9.2 8.9   Risks and benefits of anesthesia discussed at length, patient or surrogate demonstrates understanding. Appropriately NPO. Plan to proceed with anesthesia.  , MD 08/13/21

## 2021-08-13 NOTE — H&P (Signed)
HISTORY AND PHYSICAL INTERVAL NOTE:  08/13/2021  12:12 PM  Sheila Cole  has presented today for surgery, with the diagnosis of Valgus deformities of left great toes and tailors bunion.  The various methods of treatment have been discussed with the patient.  No guarantees were given.  After consideration of risks, benefits and other options for treatment, the patient has consented to surgery.  I have reviewed the patients' chart and labs.     A history and physical examination was performed in my office.  The patient was reexamined.  There have been no changes to this history and physical examination.  Sheila Cole A

## 2021-08-13 NOTE — Anesthesia Procedure Notes (Signed)
Procedure Name: LMA Insertion Date/Time: 08/13/2021 12:32 PM Performed by: Lily Kocher, CRNA Pre-anesthesia Checklist: Patient identified, Patient being monitored, Timeout performed, Emergency Drugs available and Suction available Patient Re-evaluated:Patient Re-evaluated prior to induction Oxygen Delivery Method: Circle system utilized Preoxygenation: Pre-oxygenation with 100% oxygen Induction Type: IV induction Ventilation: Mask ventilation without difficulty LMA: LMA inserted LMA Size: 4.0 Tube type: Oral Number of attempts: 1 Placement Confirmation: positive ETCO2 and breath sounds checked- equal and bilateral Tube secured with: Tape Dental Injury: Teeth and Oropharynx as per pre-operative assessment

## 2021-08-13 NOTE — Anesthesia Postprocedure Evaluation (Signed)
Anesthesia Post Note  Patient: Sheila Cole  Procedure(s) Performed: [name] TYPE (Left: Toe) TAILORS BUNION CORRECTION (Left)     Patient location during evaluation: PACU Anesthesia Type: General Level of consciousness: awake and alert Pain management: pain level controlled Vital Signs Assessment: post-procedure vital signs reviewed and stable Respiratory status: spontaneous breathing, nonlabored ventilation, respiratory function stable and patient connected to nasal cannula oxygen Cardiovascular status: blood pressure returned to baseline and stable Postop Assessment: no apparent nausea or vomiting Anesthetic complications: no   No notable events documented.  Edwyna Ready

## 2021-08-13 NOTE — Op Note (Signed)
Operative note   Surgeon:Justin Lawyer: None    Preop diagnosis:1. Hallux valgus deformity left foot 2.  Tailor's bunion left foot    Postop diagnosis: Same    Procedure: Lapidus hallux valgus correction left foot  2.  Distal fifth metatarsal osteotomy for tailor's bunion correction left foot    EBL: Minimal    Anesthesia:local and general local consisted of a total of 5 cc of 0.25% bupivacaine and 15 cc of Exparel long-acting anesthetic    Hemostasis: Mid calf tourniquet inflated to 200 mmHg for 120-minute    Specimen: None    Complications: None    Operative indications:Sheila Cole is an 60 y.o. that presents today for surgical intervention.  The risks/benefits/alternatives/complications have been discussed and consent has been given.    Procedure:  Patient was brought into the OR and placed on the operating table in thesupine position. After anesthesia was obtained theleft lower extremity was prepped and draped in usual sterile fashion.  Attention was directed to the dorsal aspect of the foot where a dorsal incision was made at the first met cuneiforms joint.  Sharp and blunt dissection was carried down to the periosteum.  Subperiosteal dissection was then undertaken.  This exposed the first met cuneiform joint.  This was then freed and loosened.  A small fulcrum was placed between the base of the first metatarsal and second metatarsal.  Next the joint positioner was placed on the medial aspect of the metatarsal.  A small stab incision was made at the second metatarsal.  Compression was placed for the positioner.  Good realignment of the first intermetatarsal angle was noted at this time.  Attention was then directed to the dorsomedial first MTPJ where an incision was performed.  Sharp and blunt dissection was carried down to the capsule.  The intermetatarsal space was then entered.  The conjoined tendon of the abductor was then freed from the base of the proximal  phalanx.  Attention was to redirected to the first met cuneiform joint.  At this time the osteotomy cut guide was placed into the joint region.  2 vertical cuts were then made.  The cartilage material was removed from the first met cuneiform joint and the joint was then prepped with a 2.0 mm drill bit.  The joint compressor was then placed.  Good compression and realignment was noted.  Next the medial and dorsal locking plates were then placed from the Gary set.  Realignment and stability was noted in all planes.  Attention was redirected to the dorsomedial first MTPJ.  A small T capsulotomy was performed.  The dorsomedial eminence was noted and transected and smoothed with a power rasp.  A small capsulorrhaphy was performed.  Closure was then performed after all areas were irrigated.  3-0 Vicryl for the capsular tissue.  4-0 Vicryl in subcutaneous tissue and a 4-0 Monocryl for the skin.   Attention was then directed to the lateral aspect of the foot where a dorsal lateral incision was performed at the fifth MTPJ.  Sharp and blunt dissection carried down to the capsule.  A longitudinal capsulotomy was performed.  At this time a Z osteotomy was created and the capital fragment was translocated more medially.  This was initially attempted stabilization with a threaded K wire with no stability noted.  At this time a 2.0 mm headless cannulated screw from the [employer] screw set was used for good stability.  Good realignment was noted in all planes.  The  wound was flushed with copious amounts of irrigation.  The ensuing overhanging ledge was transected.  Closure was then performed with a 4-0 Vicryl for the deeper and subcutaneous tissue and a 4 Monocryl for the skin.  Further local anesthesia was performed at the end of the case.    Patient tolerated the procedure and anesthesia well.  Was transported from the OR to the PACU with all vital signs stable and vascular status intact. To be discharged per  routine protocol.  Will follow up in approximately 1 week in the outpatient clinic.

## 2021-08-13 NOTE — Transfer of Care (Signed)
Immediate Anesthesia Transfer of Care Note  Patient: Sheila Cole  Procedure(s) Performed: [name] TYPE (Left: Toe) TAILORS BUNION CORRECTION (Left)  Patient Location: PACU  Anesthesia Type: General LMA  Level of Consciousness: awake, alert  and patient cooperative  Airway and Oxygen Therapy: Patient Spontanous Breathing and Patient connected to supplemental oxygen  Post-op Assessment: Post-op Vital signs reviewed, Patient's Cardiovascular Status Stable, Respiratory Function Stable, Patent Airway and No signs of Nausea or vomiting  Post-op Vital Signs: Reviewed and stable  Complications: No notable events documented.

## 2021-08-14 ENCOUNTER — Encounter: Payer: Self-pay | Admitting: Podiatry

## 2023-01-15 ENCOUNTER — Other Ambulatory Visit: Payer: Self-pay | Admitting: Pediatrics

## 2023-01-15 DIAGNOSIS — R10811 Right upper quadrant abdominal tenderness: Secondary | ICD-10-CM

## 2023-01-22 ENCOUNTER — Ambulatory Visit
Admission: RE | Admit: 2023-01-22 | Discharge: 2023-01-22 | Disposition: A | Discharge status: Home / Self Care | Payer: BC Managed Care – PPO | Source: Ambulatory Visit | Attending: Pediatrics | Admitting: Pediatrics

## 2023-01-22 DIAGNOSIS — R10811 Right upper quadrant abdominal tenderness: Secondary | ICD-10-CM | POA: Insufficient documentation

## 2023-02-05 ENCOUNTER — Other Ambulatory Visit: Payer: Self-pay | Admitting: Family Medicine

## 2023-02-05 DIAGNOSIS — R1010 Upper abdominal pain, unspecified: Secondary | ICD-10-CM

## 2023-02-09 ENCOUNTER — Other Ambulatory Visit: Payer: Self-pay | Admitting: Pediatrics

## 2023-02-09 ENCOUNTER — Encounter: Payer: Self-pay | Admitting: Family Medicine

## 2023-02-09 DIAGNOSIS — R1010 Upper abdominal pain, unspecified: Secondary | ICD-10-CM

## 2023-02-16 ENCOUNTER — Other Ambulatory Visit: Payer: BC Managed Care – PPO

## 2023-02-17 ENCOUNTER — Ambulatory Visit
Admission: RE | Admit: 2023-02-17 | Discharge: 2023-02-17 | Disposition: A | Discharge status: Home / Self Care | Payer: BC Managed Care – PPO | Source: Ambulatory Visit | Attending: Family Medicine | Admitting: Family Medicine

## 2023-02-17 DIAGNOSIS — R1010 Upper abdominal pain, unspecified: Secondary | ICD-10-CM | POA: Diagnosis present

## 2023-02-17 MED ORDER — TECHNETIUM TC 99M MEBROFENIN IV KIT
5.3500 | PACK | Freq: Once | INTRAVENOUS | Status: AC | PRN
  Administered 2023-02-17: 5.35 via INTRAVENOUS

## 2023-03-16 ENCOUNTER — Other Ambulatory Visit: Payer: Self-pay | Admitting: Pediatrics

## 2023-03-16 DIAGNOSIS — R1010 Upper abdominal pain, unspecified: Secondary | ICD-10-CM

## 2023-03-16 DIAGNOSIS — R10811 Right upper quadrant abdominal tenderness: Secondary | ICD-10-CM
